# Patient Record
Sex: Male | Born: 1970 | Race: White | Hispanic: No | Marital: Married | Smoking: Never smoker
Health system: Southern US, Community
[De-identification: ages and names within clinical notes are randomized; demographics above are authoritative.]

## PROBLEM LIST (undated history)

## (undated) DIAGNOSIS — E213 Hyperparathyroidism, unspecified: Secondary | ICD-10-CM

## (undated) DIAGNOSIS — I10 Essential (primary) hypertension: Secondary | ICD-10-CM

## (undated) HISTORY — PX: PARATHYROIDECTOMY: SHX19

---

## 2003-06-17 ENCOUNTER — Emergency Department (HOSPITAL_COMMUNITY): Admission: EM | Admit: 2003-06-17 | Discharge: 2003-06-17 | Payer: Self-pay | Admitting: Emergency Medicine

## 2005-06-03 ENCOUNTER — Encounter: Admission: RE | Admit: 2005-06-03 | Discharge: 2005-06-03 | Payer: Self-pay | Admitting: Family Medicine

## 2005-08-10 ENCOUNTER — Encounter: Admission: RE | Admit: 2005-08-10 | Discharge: 2005-08-10 | Payer: Self-pay | Admitting: Surgery

## 2006-05-14 ENCOUNTER — Encounter: Admission: RE | Admit: 2006-05-14 | Discharge: 2006-05-14 | Payer: Self-pay | Admitting: Surgery

## 2008-10-02 ENCOUNTER — Emergency Department (HOSPITAL_COMMUNITY): Admission: EM | Admit: 2008-10-02 | Discharge: 2008-10-02 | Payer: Self-pay | Admitting: Emergency Medicine

## 2009-01-20 ENCOUNTER — Ambulatory Visit (HOSPITAL_COMMUNITY): Admission: RE | Admit: 2009-01-20 | Discharge: 2009-01-21 | Payer: Self-pay | Admitting: Surgery

## 2009-01-20 ENCOUNTER — Encounter (INDEPENDENT_AMBULATORY_CARE_PROVIDER_SITE_OTHER): Payer: Self-pay | Admitting: Surgery

## 2010-12-16 ENCOUNTER — Encounter: Payer: Self-pay | Admitting: Surgery

## 2011-03-12 LAB — CALCIUM: Calcium: 10 mg/dL (ref 8.4–10.5)

## 2011-04-09 NOTE — Op Note (Signed)
Mason Mcgee, Mason Mcgee                 ACCOUNT NO.:  [id number]   MEDICAL RECORD NO.:  [id number]          PATIENT TYPE:  OIB   LOCATION:  1531                         FACILITY:  [facility]   PHYSICIAN:  Velora Heckler, MD      DATE OF BIRTH:  05-09-1971   DATE OF PROCEDURE:  01/20/2009  DATE OF DISCHARGE:                               OPERATIVE REPORT   PREOPERATIVE DIAGNOSIS:  Primary hyperparathyroidism.   POSTOPERATIVE DIAGNOSIS:  Primary hyperparathyroidism.   PROCEDURE:  1. Neck exploration.  2. Right superior parathyroidectomy.  3. Left inferior parathyroidectomy.   SURGEON:  Velora Heckler, MD, FACS   ASSISTANT:  Almond Lint, MD   ANESTHESIA:  General.   ESTIMATED BLOOD LOSS:  Minimal.   PREPARATION:  Betadine.   COMPLICATIONS:  None.   INDICATIONS:  The patient is a 40 year old white male who I have  followed for several years with borderline hyperparathyroidism.  The  patient has continued to have persistent hypercalcemia.  After  consultation with Dr. Talmage Coin, the decision was made to proceed  with neck exploration and parathyroidectomy.   DESCRIPTION OF PROCEDURE:  The procedure was done in OR #6 at the [facility].  The patient was brought to the operating room  and placed in a supine position on the operating room table.  Following  administration of general anesthesia, the patient was positioned and  then prepped and draped in the usual strict aseptic fashion.  After  ascertaining that an adequate level of anesthesia had been achieved, a  right inferior neck incision was made based upon previous ultrasound  examination and suspicious of adenoma in the right inferior position.  Dissection was carried through subcutaneous tissues and platysma and  hemostasis obtained with the electrocautery.  Subplatysmal flaps were  developed circumferentially and a Weitlaner retractor was placed for  exposure.  The strap muscles were incised in the  midline.  The strap  muscles were reflected to the right and the right thyroid lobe was  exposed.  Inferior pole was mobilized by dividing inferior venous  tributaries between medium Ligaclips.  Dissection around the inferior  pole does not reveal any abnormal parathyroid tissue.  Dissection was  carried posteriorly to the precervical fascia, laterally to the carotid  artery, medially to the trachea, and inferiorly to the thyrothymic tract  down to the level of the innominate artery.  The entire area was  explored without any evidence of abnormal parathyroid gland.  Indeed a  normal parathyroid gland was not identified.  There was no corresponding  structure to the 9-mm nodular density seen on ultrasound.  Recurrent  laryngeal nerve was identified and preserved.   At this point, a decision was made to proceed with complete neck  exploration.  Therefore, the incision was extended across the midline.  The platysma was divided.  Skin flaps were elevated cephalad and caudad  from the thyroid notch to the sternal notch.  A Mahorner self-retaining  retractor was placed for exposure.   Continued dissection on the right side allowed for complete mobilization  of  the right thyroid lobe.  Just posterior to the superior pole of the  right lobe was a nodular density just beneath the capsule.  The capsule  was incised and the structure was dissected out.  It had the appearance  of an enlarged parathyroid gland.  Vascular tributaries were divided  between small hemoclips.  The nodule was completely excised.  On  sectioning, there appeared to be two components.  They were marked with  sutures and submitted to pathology.  Dr. Jimmy Picket analyzed the  specimen and notes that there is a thyroid nodule present, as well as  parathyroid tissue which appears normal on frozen section.  A dry pack  was placed in the right neck.   Next, we turned our attention to the left thyroid lobe.  The strap  muscles  are reflected laterally.  The left lobe was mobilized.  Inferior  venous tributaries were divided between medium hemoclips, allowing for  anterior rotation of the left lobe.  Posterior to the left lobe,  exploration was continued.  The left superior parathyroid gland was  identified and was normal.  It was left in situ.  A left inferior  parathyroid gland was identified just below the level of the inferior  thyroid artery.  It appeared grossly enlarged.  It was slightly firm.  It had a slight darker color change.  Inferiorly, the thyrothymic tract  was opened and its contents were excised.  There was no evidence of  ectopic parathyroid adenoma.  Contents were submitted to pathology for  review.  There was also a nodular density on the inferior pole of the  left thyroid lobe which was excised.  This had the appearance of thyroid  tissue, but was also submitted to pathology for review.  Finally, the  left inferior parathyroid gland at the level of the inferior thyroid  artery was excised by mobilizing it completely and dividing its vascular  pedicle between medium hemoclips.  It was submitted in its entirety to  pathology for frozen section.  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   The neck was inspected for good hemostasis.  Surgicel was placed in the  operative field bilaterally.  The strap muscles were reapproximated in  the midline with interrupted 3-0 Vicryl sutures.  The platysma was  closed with interrupted 3-0 Vicryl sutures.  The skin was closed with a  running 4-0 Monocryl subcuticular suture.  The wound was washed and  dried and Benzoin Steri-Strips were applied.  Sterile dressings were  applied.  The patient was awakened from anesthesia and brought to the  recovery room in stable condition.  The patient tolerated the procedure  well.      Velora Heckler, MD  Electronically Signed     TMG/MEDQ  D:  01/20/2009  T:  01/20/2009   Job:  [id number]   cc:   Talmage Coin, M.D.   Bryan Lemma. Manus Gunning, M.D.  Fax: [phone]

## 2014-09-01 ENCOUNTER — Other Ambulatory Visit: Payer: Self-pay | Admitting: Family Medicine

## 2014-09-01 ENCOUNTER — Ambulatory Visit
Admission: RE | Admit: 2014-09-01 | Discharge: 2014-09-01 | Disposition: A | Discharge status: Home / Self Care | Payer: 59 | Source: Ambulatory Visit | Attending: Family Medicine | Admitting: Family Medicine

## 2014-09-01 DIAGNOSIS — M25462 Effusion, left knee: Secondary | ICD-10-CM

## 2014-09-13 ENCOUNTER — Other Ambulatory Visit: Payer: Self-pay | Admitting: Family Medicine

## 2015-03-15 ENCOUNTER — Other Ambulatory Visit: Payer: Self-pay | Admitting: Family Medicine

## 2015-05-10 ENCOUNTER — Ambulatory Visit
Admission: RE | Admit: 2015-05-10 | Discharge: 2015-05-10 | Disposition: A | Discharge status: Home / Self Care | Payer: 59 | Source: Ambulatory Visit | Attending: Family Medicine | Admitting: Family Medicine

## 2015-05-10 ENCOUNTER — Other Ambulatory Visit: Payer: Self-pay | Admitting: Family Medicine

## 2015-05-10 DIAGNOSIS — R0781 Pleurodynia: Secondary | ICD-10-CM

## 2016-02-05 ENCOUNTER — Emergency Department (INDEPENDENT_AMBULATORY_CARE_PROVIDER_SITE_OTHER)
Admission: EM | Admit: 2016-02-05 | Discharge: 2016-02-05 | Disposition: A | Discharge status: Home / Self Care | Payer: BLUE CROSS/BLUE SHIELD | Source: Home / Self Care | Attending: Emergency Medicine | Admitting: Emergency Medicine

## 2016-02-05 ENCOUNTER — Encounter (HOSPITAL_COMMUNITY): Payer: Self-pay | Admitting: Emergency Medicine

## 2016-02-05 DIAGNOSIS — L03115 Cellulitis of right lower limb: Secondary | ICD-10-CM | POA: Diagnosis not present

## 2016-02-05 DIAGNOSIS — M7041 Prepatellar bursitis, right knee: Secondary | ICD-10-CM | POA: Diagnosis not present

## 2016-02-05 HISTORY — DX: Essential (primary) hypertension: I10

## 2016-02-05 HISTORY — DX: Hyperparathyroidism, unspecified: E21.3

## 2016-02-05 MED ORDER — CLINDAMYCIN HCL 300 MG PO CAPS: 300.0000 mg | ORAL_CAPSULE | Freq: Three times a day (TID) | ORAL | Status: DC

## 2016-02-05 MED ORDER — CEFTRIAXONE SODIUM 1 G IJ SOLR
1.0000 g | Freq: Once | INTRAMUSCULAR | Status: AC
  Administered 2016-02-05: 1 g via INTRAMUSCULAR

## 2016-02-05 MED ORDER — LIDOCAINE HCL (PF) 1 % IJ SOLN
INTRAMUSCULAR | Status: AC
  Filled 2016-02-05: qty 5

## 2016-02-05 MED ORDER — CEFTRIAXONE SODIUM 1 G IJ SOLR
INTRAMUSCULAR | Status: AC
  Filled 2016-02-05: qty 10

## 2016-02-05 MED ORDER — LIDOCAINE-EPINEPHRINE (PF) 2 %-1:200000 IJ SOLN
INTRAMUSCULAR | Status: AC
  Filled 2016-02-05: qty 20

## 2016-02-05 NOTE — ED Notes (Signed)
Patient not ready for discharge-patient must wait-post injection delay

## 2016-02-05 NOTE — Discharge Instructions (Signed)
Cellulitis Cellulitis is an infection of the skin and the tissue beneath it. The infected area is usually red and tender. Cellulitis occurs most often in the arms and lower legs.  CAUSES  Cellulitis is caused by bacteria that enter the skin through cracks or cuts in the skin. The most common types of bacteria that cause cellulitis are staphylococci and streptococci. SIGNS AND SYMPTOMS   Redness and warmth.  Swelling.  Tenderness or pain.  Fever. DIAGNOSIS  Your health care provider can usually determine what is wrong based on a physical exam. Blood tests may also be done. TREATMENT  Treatment usually involves taking an antibiotic medicine. HOME CARE INSTRUCTIONS   Take your antibiotic medicine as directed by your health care provider. Finish the antibiotic even if you start to feel better.  Keep the infected arm or leg elevated to reduce swelling.  Apply a warm cloth to the affected area up to 4 times per day to relieve pain.  Take medicines only as directed by your health care provider.  Keep all follow-up visits as directed by your health care provider. SEEK MEDICAL CARE IF:   You notice red streaks coming from the infected area.  Your red area gets larger or turns dark in color.  Your bone or joint underneath the infected area becomes painful after the skin has healed.  Your infection returns in the same area or another area.  You notice a swollen bump in the infected area.  You develop new symptoms.  You have a fever. SEEK IMMEDIATE MEDICAL CARE IF:   You feel very sleepy.  You develop vomiting or diarrhea.  You have a general ill feeling (malaise) with muscle aches and pains.   This information is not intended to replace advice given to you by your health care provider. Make sure you discuss any questions you have with your health care provider.   Document Released: 08/21/2005 Document Revised: 08/02/2015 Document Reviewed: 01/27/2012 Elsevier Interactive  Patient Education 2016 [employer]  Prepatellar Bursitis With Rehab  Bursitis is a condition that is characterized by inflammation of a bursa. Kateri McBursa exists in many areas of the body. They are fluid-filled sacs that lie between a soft tissue (skin, tendon, or ligament) and a bone, and they reduce friction between the structures as well as the stress placed on the soft tissue. Prepatellar bursitis is inflammation of the bursa that lies between the skin and the kneecap (patella). This condition often causes pain over the patella. SYMPTOMS   Pain, tenderness, and/or inflammation over the patella.  Pain that worsens with movement of the knee joint.  Decreased range of motion for the knee joint.  A crackling sound (crepitation) when the bursa is moved or touched.  Occasionally, painless swelling of the bursa.  Fever (when infected). CAUSES  Bursitis is caused by damage to the bursa, which results in an inflammatory response. Common mechanisms of injury include:  Direct trauma to the front of the knee.  Repetitive and/or stressful use of the knee. RISK INCREASES WITH:  Activities in which kneeling and/or falling on one's knees is likely (volleyball or football).  Repetitive and stressful training, especially if it involves running on hills.  Improper training techniques, such as a sudden increase in the intensity, frequency, or duration of training.  Failure to warm up properly before activity.  Poor technique.  Artificial turf. PREVENTION   Avoid kneeling or falling on your knees.  Warm up and stretch properly before activity.  Allow for adequate recovery  between workouts.  Maintain physical fitness:  Strength, flexibility, and endurance.  Cardiovascular fitness.  Learn and use proper technique. When possible, have a coach correct improper technique.  Wear properly fitted and padded protective equipment (knee pads). PROGNOSIS  If treated properly, then the symptoms  of prepatellar bursitis usually resolve within 2 weeks. RELATED COMPLICATIONS   Recurrent symptoms that result in a chronic problem.  Prolonged healing time, if improperly treated or reinjured.  Limited range of motion.  Infection of bursa.  Chronic inflammation or scarring of bursa. TREATMENT  Treatment initially involves the use of ice and medication to help reduce pain and inflammation. The use of strengthening and stretching exercises may help reduce pain with activity, especially those of the quadriceps and hamstring muscles. These exercises may be performed at home or with referral to a therapist. Your caregiver may recommend knee pads when you return to playing sports, in order to reduce the stress on the prepatellar bursa. If symptoms persist despite treatment, then your caregiver may drain fluid out with a needle (aspirate) the bursa. If symptoms persist for greater than 6 months despite nonsurgical (conservative) treatment, then surgery may be recommended to remove the bursa.  MEDICATION  If pain medication is necessary, then nonsteroidal anti-inflammatory medications, such as aspirin and ibuprofen, or other minor pain relievers, such as acetaminophen, are often recommended.  Do not take pain medication for 7 days before surgery.  Prescription pain relievers may be given if deemed necessary by your caregiver. Use only as directed and only as much as you need.  Corticosteroid injections may be given by your caregiver. These injections should be reserved for the most serious cases, because they may only be given a certain number of times. HEAT AND COLD  Cold treatment (icing) relieves pain and reduces inflammation. Cold treatment should be applied for 10 to 15 minutes every 2 to 3 hours for inflammation and pain and immediately after any activity that aggravates your symptoms. Use ice packs or massage the area with a piece of ice (ice massage).  Heat treatment may be used prior to  performing the stretching and strengthening activities prescribed by your caregiver, physical therapist, or athletic trainer. Use a heat pack or soak the injury in warm water. SEEK MEDICAL CARE IF:  Treatment seems to offer no benefit, or the condition worsens.  Any medications produce adverse side effects. EXERCISES RANGE OF MOTION (ROM) AND STRETCHING EXERCISES - Prepatellar Bursitis These exercises may help you when beginning to rehabilitate your injury. Your symptoms may resolve with or without further involvement from your physician, physical therapist or athletic trainer. While completing these exercises, remember:   Restoring tissue flexibility helps normal motion to return to the joints. This allows healthier, less painful movement and activity.  An effective stretch should be held for at least 30 seconds.  A stretch should never be painful. You should only feel a gentle lengthening or release in the stretched tissue. STRETCH - Hamstrings, Standing  Stand or sit and extend your right / left leg, placing your foot on a chair or foot stool  Keeping a slight arch in your low back and your hips straight forward.  Lead with your chest and lean forward at the waist until you feel a gentle stretch in the back of your right / left knee or thigh. (When done correctly, this exercise requires leaning only a small distance.)  Hold this position for __________ seconds. Repeat __________ times. Complete this stretch __________ times per day. STRETCH -  Quadriceps, Prone   Lie on your stomach on a firm surface, such as a bed or padded floor.  Bend your right / left knee and grasp your ankle. If you are unable to reach, your ankle or pant leg, use a belt around your foot to lengthen your reach.  Gently pull your heel toward your buttocks. Your knee should not slide out to the side. You should feel a stretch in the front of your thigh and/or knee.  Hold this position for __________  seconds. Repeat __________ times. Complete this stretch __________ times per day.  STRETCH - Hamstrings/Adductors, V-Sit   Sit on the floor with your legs extended in a large "V," keeping your knees straight.  With your head and chest upright, bend at your waist reaching for your right foot to stretch your left adductors.  You should feel a stretch in your left inner thigh. Hold for __________ seconds.  Return to the upright position to relax your leg muscles.  Continuing to keep your chest upright, bend straight forward at your waist to stretch your hamstrings.  You should feel a stretch behind both of your thighs and/or knees. Hold for __________ seconds.  Return to the upright position to relax your leg muscles.  Repeat steps 2 through 4. Repeat __________ times. Complete this exercise __________ times per day.  STRENGTHENING EXERCISES - Prepatellar Bursitis  These exercises may help you when beginning to rehabilitate your injury. They may resolve your symptoms with or without further involvement from your physician, physical therapist or athletic trainer. While completing these exercises, remember:  Muscles can gain both the endurance and the strength needed for everyday activities through controlled exercises.  Complete these exercises as instructed by your physician, physical therapist or athletic trainer. Progress the resistance and repetitions only as guided. STRENGTH - Quadriceps, Isometrics  Lie on your back with your right / left leg extended and your opposite knee bent.  Gradually tense the muscles in the front of your right / left thigh. You should see either your kneecap slide up toward your hip or increased dimpling just above the knee. This motion will push the back of the knee down toward the floor/mat/bed on which you are lying.  Hold the muscle as tight as you can without increasing your pain for __________ seconds.  Relax the muscles slowly and completely in  between each repetition. Repeat __________ times. Complete this exercise __________ times per day.  STRENGTH - Quadriceps, Short Arcs   Lie on your back. Place a __________ inch towel roll under your knee so that the knee slightly bends.  Raise only your lower leg by tightening the muscles in the front of your thigh. Do not allow your thigh to rise.  Hold this position for __________ seconds. Repeat __________ times. Complete this exercise __________ times per day.  OPTIONAL ANKLE WEIGHTS: Begin with ____________________, but DO NOT exceed ____________________. Increase in1 lb/0.5 kg increments.  STRENGTH - Quadriceps, Straight Leg Raises  Quality counts! Watch for signs that the quadriceps muscle is working to insure you are strengthening the correct muscles and not "cheating" by substituting with healthier muscles.  Lay on your back with your right / left leg extended and your opposite knee bent.  Tense the muscles in the front of your right / left thigh. You should see either your kneecap slide up or increased dimpling just above the knee. Your thigh may even quiver.  Tighten these muscles even more and raise your leg 4 to  6 inches off the floor. Hold for __________ seconds.  Keeping these muscles tense, lower your leg.  Relax the muscles slowly and completely in between each repetition. Repeat __________ times. Complete this exercise __________ times per day.  STRENGTH - Quadriceps, Step-Ups   Use a thick book, step or step stool that is __________ inches tall.  Holding a wall or counter for balance only, not support.  Slowly step-up with your right / left foot, keeping your knee in line with your hip and foot. Do not allow your knee to bend so far that you cannot see your toes.  Slowly unlock your knee and lower yourself to the starting position. Your muscles, not gravity, should lower you. Repeat __________ times. Complete this exercise __________ times per day.   This  information is not intended to replace advice given to you by your health care provider. Make sure you discuss any questions you have with your health care provider.   Document Released: 11/11/2005 Document Revised: 08/02/2015 Document Reviewed: 02/23/2009 Elsevier Interactive Patient Education 2016 Elsevier Inc.  [employer] Therapy Heat therapy can help ease sore, stiff, injured, and tight muscles and joints. Heat relaxes your muscles, which may help ease your pain. Heat therapy should only be used on old, pre-existing, or long-lasting (chronic) injuries. Do not use heat therapy unless told by your doctor. HOW TO USE HEAT THERAPY There are several different kinds of heat therapy, including:  Moist heat pack.  Warm water bath.  Hot water bottle.  Electric heating pad.  Heated gel pack.  Heated wrap.  Electric heating pad. GENERAL HEAT THERAPY RECOMMENDATIONS   Do not sleep while using heat therapy. Only use heat therapy while you are awake.  Your skin may turn pink while using heat therapy. Do not use heat therapy if your skin turns red.  Do not use heat therapy if you have new pain.  High heat or long exposure to heat can cause burns. Be careful when using heat therapy to avoid burning your skin.  Do not use heat therapy on areas of your skin that are already irritated, such as with a rash or sunburn. GET HELP IF:   You have blisters, redness, swelling (puffiness), or numbness.  You have new pain.  Your pain is worse. MAKE SURE YOU:  Understand these instructions.  Will watch your condition.  Will get help right away if you are not doing well or get worse.   This information is not intended to replace advice given to you by your health care provider. Make sure you discuss any questions you have with your health care provider.   Document Released: 02/03/2012 Document Revised: 12/02/2014 Document Reviewed: 01/04/2014 Elsevier Interactive Patient Education [employer].

## 2016-02-05 NOTE — ED Provider Notes (Signed)
CSN: [id number]     Arrival date & time 02/05/16  1306 History   First MD Initiated Contact with Patient 02/05/16 1508     Chief Complaint  Patient presents with  . Knee Pain   (Consider location/radiation/quality/duration/timing/severity/associated sxs/prior Treatment) HPI Comments: 45 year old male is accompanied by his wife with a complaint of redness, pain and swelling over the knee and below the knee. 3 days ago there was a "infected hair" just below the patella. He removed the infected hair and was able to express some pus. Within the ensuing couple of days there has developed a localized swelling just below the patella. The appearance is similar to swelling of a prepatellar bursa. There is also erythema surrounding this area of swelling that extends to the patella as well has distally along the anterior leg. There is no calf pain or tenderness. No erythema or swelling to the calf. Denies systemic symptoms.   Past Medical History  Diagnosis Date  . Hyperparathyroidism (HCC)   . Hypertension    Past Surgical History  Procedure Laterality Date  . Parathyroidectomy     History reviewed. No pertinent family history. Social History  Substance Use Topics  . Smoking status: Never Smoker   . Smokeless tobacco: None  . Alcohol Use: Yes    Review of Systems  Constitutional: Negative.  Negative for fever and fatigue.  Respiratory: Negative.   Genitourinary: Negative.   Musculoskeletal: Negative.   Skin: Positive for color change. Negative for rash.       As per history of present illness.  Neurological: Negative.   All other systems reviewed and are negative.   Allergies  Review of patient's allergies indicates no known allergies.  Home Medications   Prior to Admission medications   Medication Sig Start Date End Date Taking? Authorizing Provider  Cholecalciferol (VITAMIN D PO) Take by mouth.   Yes Historical Provider, MD  FIBER PO Take by mouth.   Yes Historical Provider, MD   Glucosamine HCl (GLUCOSAMINE PO) Take by mouth.   Yes Historical Provider, MD  lisinopril (PRINIVIL,ZESTRIL) 10 MG tablet Take 10 mg by mouth daily.   Yes Historical Provider, MD  Multiple Vitamin (MULTIVITAMIN) capsule Take 1 capsule by mouth daily.   Yes Historical Provider, MD  clindamycin (CLEOCIN) 300 MG capsule Take 1 capsule (300 mg total) by mouth 3 (three) times daily. 02/05/16   Hayden Rasmussen, NP   Meds Ordered and Administered this Visit   Medications  cefTRIAXone (ROCEPHIN) injection 1 g (not administered)    There were no vitals taken for this visit. No data found.   Physical Exam  Constitutional: He is oriented to person, place, and time. He appears well-developed and well-nourished. No distress.  Eyes: EOM are normal.  Neck: Normal range of motion. Neck supple.  Cardiovascular: Normal rate.   Pulmonary/Chest: Effort normal. No respiratory distress.  Musculoskeletal: He exhibits no edema.  Knee with full range of motion, extension and flexion. No tenderness over the knee joint. As per history of present illness there is a mounded area of swelling that is well localized and annular. It is primarily flesh-colored. Measures approximately 4 cm across. It is tender. It is firm but palpates as fluctuant.  Neurological: He is alert and oriented to person, place, and time. No cranial nerve deficit. He exhibits normal muscle tone.  Skin: Skin is warm and dry.  Psychiatric: He has a normal mood and affect.  Nursing note and vitals reviewed.   ED Course  Aspiration of  blood/fluid Date/Time: 02/05/2016 3:56 PM Performed by: Phineas RealMABE, Elisavet Buehrer Authorized by: Charm RingsHONIG, ERIN J Consent: Verbal consent obtained. Risks and benefits: risks, benefits and alternatives were discussed Consent given by: patient Patient understanding: patient states understanding of the procedure being performed Patient identity confirmed: verbally with patient Preparation: Patient was prepped and draped in the usual  sterile fashion. Local anesthesia used: yes Anesthesia: local infiltration Local anesthetic: lidocaine 2% with epinephrine Anesthetic total: 2 ml Patient sedated: no Patient tolerance: Patient tolerated the procedure well with no immediate complications Comments: Using an 18-gauge needle attached to a syringe the needle was [facility] area of swelling just below the patella. Approximately 3 mm of clear liquid was removed. There was no purulence or bleeding.   (including critical care time)  Labs Review Labs Reviewed - No data to display  Imaging Review No results found.   Visual Acuity Review  Right Eye Distance:   Left Eye Distance:   Bilateral Distance:    Right Eye Near:   Left Eye Near:    Bilateral Near:         MDM   1. Cellulitis of right lower extremity   2. Prepatellar bursitis of right knee    Meds ordered this encounter  Medications  . lisinopril (PRINIVIL,ZESTRIL) 10 MG tablet    Sig: Take 10 mg by mouth daily.  Marland Kitchen. FIBER PO    Sig: Take by mouth.  . Multiple Vitamin (MULTIVITAMIN) capsule    Sig: Take 1 capsule by mouth daily.  . Glucosamine HCl (GLUCOSAMINE PO)    Sig: Take by mouth.  . Cholecalciferol (VITAMIN D PO)    Sig: Take by mouth.  . cefTRIAXone (ROCEPHIN) injection 1 g    Sig:   . clindamycin (CLEOCIN) 300 MG capsule    Sig: Take 1 capsule (300 mg total) by mouth 3 (three) times daily.    Dispense:  30 capsule    Refill:  0    Order Specific Question:  Supervising Provider    Answer:  Charm RingsHONIG, ERIN J [[id number]]   Heat, meds as directed. NSAIDS for pain     Hayden Rasmussenavid Daviana Haymaker, NP 02/05/16 [id number]

## 2016-02-05 NOTE — ED Notes (Addendum)
Right knee pain, swelling.  Patient and spouse reports plucking a "infected hair" from knee and expelled pus 2 days ago.  Now reports pain significantly worse.  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

## 2016-02-28 DIAGNOSIS — I10 Essential (primary) hypertension: Secondary | ICD-10-CM | POA: Diagnosis not present

## 2016-02-28 DIAGNOSIS — E7800 Pure hypercholesterolemia, unspecified: Secondary | ICD-10-CM | POA: Diagnosis not present

## 2016-02-28 DIAGNOSIS — E213 Hyperparathyroidism, unspecified: Secondary | ICD-10-CM | POA: Diagnosis not present

## 2016-02-28 DIAGNOSIS — R7301 Impaired fasting glucose: Secondary | ICD-10-CM | POA: Diagnosis not present

## 2016-03-12 DIAGNOSIS — M25861 Other specified joint disorders, right knee: Secondary | ICD-10-CM | POA: Diagnosis not present

## 2016-09-06 DIAGNOSIS — J029 Acute pharyngitis, unspecified: Secondary | ICD-10-CM | POA: Diagnosis not present

## 2016-09-06 DIAGNOSIS — R05 Cough: Secondary | ICD-10-CM | POA: Diagnosis not present

## 2016-09-18 DIAGNOSIS — E213 Hyperparathyroidism, unspecified: Secondary | ICD-10-CM | POA: Diagnosis not present

## 2016-09-18 DIAGNOSIS — Z0000 Encounter for general adult medical examination without abnormal findings: Secondary | ICD-10-CM | POA: Diagnosis not present

## 2016-09-18 DIAGNOSIS — R7301 Impaired fasting glucose: Secondary | ICD-10-CM | POA: Diagnosis not present

## 2016-09-18 DIAGNOSIS — I10 Essential (primary) hypertension: Secondary | ICD-10-CM | POA: Diagnosis not present

## 2016-09-18 DIAGNOSIS — E7800 Pure hypercholesterolemia, unspecified: Secondary | ICD-10-CM | POA: Diagnosis not present

## 2017-04-09 DIAGNOSIS — E213 Hyperparathyroidism, unspecified: Secondary | ICD-10-CM | POA: Diagnosis not present

## 2017-04-09 DIAGNOSIS — I10 Essential (primary) hypertension: Secondary | ICD-10-CM | POA: Diagnosis not present

## 2017-04-09 DIAGNOSIS — E7800 Pure hypercholesterolemia, unspecified: Secondary | ICD-10-CM | POA: Diagnosis not present

## 2017-04-09 DIAGNOSIS — R7301 Impaired fasting glucose: Secondary | ICD-10-CM | POA: Diagnosis not present

## 2017-10-22 DIAGNOSIS — E213 Hyperparathyroidism, unspecified: Secondary | ICD-10-CM | POA: Diagnosis not present

## 2017-10-22 DIAGNOSIS — R7301 Impaired fasting glucose: Secondary | ICD-10-CM | POA: Diagnosis not present

## 2017-10-22 DIAGNOSIS — Z0001 Encounter for general adult medical examination with abnormal findings: Secondary | ICD-10-CM | POA: Diagnosis not present

## 2017-10-22 DIAGNOSIS — I10 Essential (primary) hypertension: Secondary | ICD-10-CM | POA: Diagnosis not present

## 2017-10-22 DIAGNOSIS — E7800 Pure hypercholesterolemia, unspecified: Secondary | ICD-10-CM | POA: Diagnosis not present

## 2017-10-29 DIAGNOSIS — M25562 Pain in left knee: Secondary | ICD-10-CM | POA: Diagnosis not present

## 2018-04-22 DIAGNOSIS — I10 Essential (primary) hypertension: Secondary | ICD-10-CM | POA: Diagnosis not present

## 2018-04-22 DIAGNOSIS — E213 Hyperparathyroidism, unspecified: Secondary | ICD-10-CM | POA: Diagnosis not present

## 2018-04-22 DIAGNOSIS — R531 Weakness: Secondary | ICD-10-CM | POA: Diagnosis not present

## 2018-04-22 DIAGNOSIS — E7800 Pure hypercholesterolemia, unspecified: Secondary | ICD-10-CM | POA: Diagnosis not present

## 2018-04-22 DIAGNOSIS — R7301 Impaired fasting glucose: Secondary | ICD-10-CM | POA: Diagnosis not present

## 2018-05-03 ENCOUNTER — Emergency Department (HOSPITAL_COMMUNITY)
Admission: EM | Admit: 2018-05-03 | Discharge: 2018-05-03 | Disposition: A | Discharge status: Home / Self Care | Payer: BLUE CROSS/BLUE SHIELD | Attending: Emergency Medicine | Admitting: Emergency Medicine

## 2018-05-03 ENCOUNTER — Encounter (HOSPITAL_COMMUNITY): Payer: Self-pay | Admitting: Emergency Medicine

## 2018-05-03 ENCOUNTER — Emergency Department (HOSPITAL_COMMUNITY): Payer: BLUE CROSS/BLUE SHIELD

## 2018-05-03 DIAGNOSIS — R51 Headache: Secondary | ICD-10-CM | POA: Insufficient documentation

## 2018-05-03 DIAGNOSIS — I10 Essential (primary) hypertension: Secondary | ICD-10-CM | POA: Diagnosis not present

## 2018-05-03 DIAGNOSIS — S199XXA Unspecified injury of neck, initial encounter: Secondary | ICD-10-CM | POA: Diagnosis not present

## 2018-05-03 DIAGNOSIS — M25512 Pain in left shoulder: Secondary | ICD-10-CM | POA: Insufficient documentation

## 2018-05-03 DIAGNOSIS — R0789 Other chest pain: Secondary | ICD-10-CM | POA: Diagnosis not present

## 2018-05-03 NOTE — Discharge Instructions (Addendum)
Take ibuprofen 3 times a day with meals.  Do not take other anti-inflammatories at the same time open (Advil, Motrin, naproxen, Aleve). You may supplement with Tylenol if you need further pain control. You may try muscle creams to help with pain (bengay, salonpas, icy hot). Use ice packs or heating pads if this helps control your pain. You may do stretches and a massage for pain control.  You will likely have continued muscle stiffness and soreness over the next couple days.  Follow-up with primary care in 1 week if your symptoms are not improving. Return to the emergency room if you develop vision changes, vomiting, slurred speech, numbness, loss of bowel or bladder control, or any new or worsening symptoms.
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
## 2018-05-13 DIAGNOSIS — M25512 Pain in left shoulder: Secondary | ICD-10-CM | POA: Diagnosis not present

## 2018-05-13 DIAGNOSIS — M79671 Pain in right foot: Secondary | ICD-10-CM | POA: Diagnosis not present

## 2018-05-13 DIAGNOSIS — S4992XA Unspecified injury of left shoulder and upper arm, initial encounter: Secondary | ICD-10-CM | POA: Diagnosis not present

## 2018-05-25 DIAGNOSIS — M25512 Pain in left shoulder: Secondary | ICD-10-CM | POA: Diagnosis not present

## 2018-05-25 DIAGNOSIS — M542 Cervicalgia: Secondary | ICD-10-CM | POA: Diagnosis not present

## 2018-06-04 DIAGNOSIS — M25512 Pain in left shoulder: Secondary | ICD-10-CM | POA: Diagnosis not present

## 2018-06-04 DIAGNOSIS — M542 Cervicalgia: Secondary | ICD-10-CM | POA: Diagnosis not present

## 2018-06-12 DIAGNOSIS — M25512 Pain in left shoulder: Secondary | ICD-10-CM | POA: Diagnosis not present

## 2018-06-12 DIAGNOSIS — M542 Cervicalgia: Secondary | ICD-10-CM | POA: Diagnosis not present

## 2018-06-16 DIAGNOSIS — M542 Cervicalgia: Secondary | ICD-10-CM | POA: Diagnosis not present

## 2018-06-16 DIAGNOSIS — M25512 Pain in left shoulder: Secondary | ICD-10-CM | POA: Diagnosis not present

## 2018-06-23 DIAGNOSIS — M542 Cervicalgia: Secondary | ICD-10-CM | POA: Diagnosis not present

## 2018-06-23 DIAGNOSIS — M25512 Pain in left shoulder: Secondary | ICD-10-CM | POA: Diagnosis not present

## 2018-06-26 ENCOUNTER — Encounter: Payer: Self-pay | Admitting: Endocrinology

## 2018-06-26 ENCOUNTER — Ambulatory Visit (INDEPENDENT_AMBULATORY_CARE_PROVIDER_SITE_OTHER): Payer: BLUE CROSS/BLUE SHIELD | Admitting: Endocrinology

## 2018-06-26 DIAGNOSIS — E162 Hypoglycemia, unspecified: Secondary | ICD-10-CM | POA: Diagnosis not present

## 2018-06-26 MED ORDER — METFORMIN HCL ER 500 MG PO TB24: 500.0000 mg | ORAL_TABLET | Freq: Every day | ORAL | 3 refills | Status: DC

## 2018-06-26 NOTE — Patient Instructions (Addendum)
I have sent a prescription to your pharmacy, for metformin-XR. Please continue to check if you have symptoms Please come back for a follow-up appointment in 2 months.    Hypoglycemia Hypoglycemia occurs when the level of sugar (glucose) in the blood is too low. Glucose is a type of sugar that provides the body's main source of energy. Certain hormones (insulin and glucagon) control the level of glucose in the blood. Insulin lowers blood glucose, and glucagon increases blood glucose. Hypoglycemia can result from having too much insulin in the bloodstream, or from not eating enough food that contains glucose. Hypoglycemia can happen in people who do or do not have diabetes. It can develop quickly, and it can be a medical emergency. What are the causes? Hypoglycemia occurs most often in people who have diabetes. If you have diabetes, hypoglycemia may be caused by:  Diabetes medicine.  Not eating enough, or not eating often enough.  Increased physical activity.  Drinking alcohol, especially when you have not eaten recently.  If you do not have diabetes, hypoglycemia may be caused by:  A tumor in the pancreas. The pancreas is the organ that makes insulin.  Not eating enough, or not eating for long periods at a time (fasting).  Severe infection or illness that affects the liver, heart, or kidneys.  Certain medicines.  You may also have reactive hypoglycemia. This condition causes hypoglycemia within 4 hours of eating a meal. This may occur after having stomach surgery. Sometimes, the cause of reactive hypoglycemia is not known. What increases the risk? Hypoglycemia is more likely to develop in:  People who have diabetes and take medicines to lower blood glucose.  People who abuse alcohol.  People who have a severe illness.  What are the signs or symptoms? Hypoglycemia may not cause any symptoms. If you have symptoms, they may include:  Hunger.  Anxiety.  Sweating and feeling  clammy.  Confusion.  Dizziness or feeling light-headed.  Sleepiness.  Nausea.  Increased heart rate.  Headache.  Blurry vision.  Seizure.  Nightmares.  Tingling or numbness around the mouth, lips, or tongue.  A change in speech.  Decreased ability to concentrate.  A change in coordination.  Restless sleep.  Tremors or shakes.  Fainting.  Irritability.  How is this diagnosed? Hypoglycemia is diagnosed with a blood test to measure your blood glucose level. This blood test is done while you are having symptoms. Your health care provider may also do a physical exam and review your medical history. If you do not have diabetes, other tests may be done to find the cause of your hypoglycemia. How is this treated? This condition can often be treated by immediately eating or drinking something that contains glucose, such as:  3-4 sugar tablets (glucose pills).  Glucose gel, 15-gram tube.  Fruit juice, 4 oz (120 mL).  Regular soda (not diet soda), 4 oz (120 mL).  Low-fat milk, 4 oz (120 mL).  Several pieces of hard candy.  Sugar or honey, 1 Tbsp.  Treating Severe Hypoglycemia Severe hypoglycemia is when your blood glucose level is at or below 54 mg/dL (3 mmol/L). Severe hypoglycemia is an emergency. Do not wait to see if the symptoms will go away. Get medical help right away. Call your local emergency services (911 in the [place] Do not drive yourself to the hospital. If you have severe hypoglycemia and you cannot eat or drink, you may need an injection of glucagon. A family member or close friend should learn how  to check your blood glucose and how to give you a glucagon injection. Ask your health care provider if you need to have an emergency glucagon injection kit available. Severe hypoglycemia may need to be treated in a hospital. The treatment may include getting glucose through an IV tube. You may also need treatment for the cause of your hypoglycemia. Follow  these instructions at home: General instructions  Avoid any diets that cause you to not eat enough food. Talk with your health care provider before you start any new diet.  Take over-the-counter and prescription medicines only as told by your health care provider.  Limit alcohol intake to no more than 1 drink per day for nonpregnant women and 2 drinks per day for men. One drink equals 12 oz of beer, 5 oz of wine, or 1 oz of hard liquor.  Keep all follow-up visits as told by your health care provider. This is important.  If You Have Reactive Hypoglycemia or Low Blood Sugar From Other Causes:  Monitor your blood glucose as told by your health care provider.  Follow instructions from your health care provider about eating or drinking restrictions. Contact a health care provider if:  You have problems keeping your blood glucose in your target range.  You have frequent episodes of hypoglycemia. Get help right away if:  You continue to have hypoglycemia symptoms after eating or drinking something containing glucose.  Your blood glucose is at or below 54 mg/dL (3 mmol/L).  You have a seizure.  You faint. These symptoms may represent a serious problem that is an emergency. Do not wait to see if the symptoms will go away. Get medical help right away. Call your local emergency services (911 in the [place] Do not drive yourself to the hospital. This information is not intended to replace advice given to you by your health care provider. Make sure you discuss any questions you have with your health care provider. Document Released: 11/11/2005 Document Revised: 04/24/2016 Document Reviewed: 12/15/2015 Elsevier Interactive Patient Education  [employer].

## 2018-06-26 NOTE — Progress Notes (Signed)
Subjective:    Patient ID: Mason Mcgee, male    DOB: 1971-07-27, 47 y.o.   MRN: [id number]  HPI Pt is referred by Dr Marisue Humble, for hypoglycemia.  Pt reports many years of intermittent severe tremor of the hands, and assoc diaphoresis.  This happens approx 20 after eating, especially with exercise.   He has checked cbg during the episode, and got 64-80.  Symptoms are relieved by eating or drinking.  he takes no medication for the blood sugar.  he has never had weight loss surgery, alcohol intake, adrenal problems, pituitary problems, liver problems, or kidney problems.   Past Medical History:  Diagnosis Date  . Hyperparathyroidism ([place])   . Hypertension     Past Surgical History:  Procedure Laterality Date  . PARATHYROIDECTOMY      Social History   Socioeconomic History  . Marital status: Married    Spouse name: Not on file  . Number of children: Not on file  . Years of education: Not on file  . Highest education level: Not on file  Occupational History  . Not on file  Social Needs  . Financial resource strain: Not on file  . Food insecurity:    Worry: Not on file    Inability: Not on file  . Transportation needs:    Medical: Not on file    Non-medical: Not on file  Tobacco Use  . Smoking status: Never Smoker  . Smokeless tobacco: Never Used  Substance and Sexual Activity  . Alcohol use: Yes  . Drug use: No  . Sexual activity: Not on file  Lifestyle  . Physical activity:    Days per week: Not on file    Minutes per session: Not on file  . Stress: Not on file  Relationships  . Social connections:    Talks on phone: Not on file    Gets together: Not on file    Attends religious service: Not on file    Active member of club or organization: Not on file    Attends meetings of clubs or organizations: Not on file    Relationship status: Not on file  . Intimate partner violence:    Fear of current or ex partner: Not on file    Emotionally abused: Not on file   Physically abused: Not on file    Forced sexual activity: Not on file  Other Topics Concern  . Not on file  Social History Narrative  . Not on file    Current Outpatient Medications on File Prior to Visit  Medication Sig Dispense Refill  . Cholecalciferol (VITAMIN D PO) Take by mouth.    . FIBER PO Take by mouth.    . Glucosamine HCl (GLUCOSAMINE PO) Take by mouth.    Marland Kitchen lisinopril (PRINIVIL,ZESTRIL) 10 MG tablet Take 10 mg by mouth daily.    . Loratadine 10 MG CAPS     . Multiple Vitamin (MULTIVITAMIN) capsule Take 1 capsule by mouth daily.    Marland Kitchen omeprazole (PRILOSEC) 10 MG capsule 20 mg daily.    . Probiotic Product (PROBIOTIC-10) CAPS      No current facility-administered medications on file prior to visit.     No Known Allergies  No family history on file.  BP 110/80 (BP Location: Right Arm, Patient Position: Sitting, Cuff Size: Normal)   Pulse (!) 105   Temp 98.1 F (36.7 C) (Oral)   Ht 6' (1.829 m)   Wt 235 lb (106.6 kg)   SpO2  97%   BMI 31.87 kg/m    Review of Systems denies headache, fever, palpitations, anxiety, rash, visual loss, abdominal pain, sob, urinary frequency, arthralgias, gynecomastia, cramps, cold intolerance, muscle weakness, n/v, rhinorrhea,  easy bruising, numbness, seizure, or LOC.  He has gained weight.  He has intermitt diarrhea.      Objective:   Physical Exam VS: see vs page GEN: no distress HEAD: head: no deformity eyes: no periorbital swelling, no proptosis external nose and ears are normal mouth: no lesion seen NECK: a healed scar is present.  I do not appreciate a nodule in the thyroid or elsewhere in the neck CHEST WALL: no deformity LUNGS: clear to auscultation CV: reg rate and rhythm, no murmur ABD: abdomen is soft, nontender.  no hepatosplenomegaly.  not distended.  no hernia MUSCULOSKELETAL: muscle bulk and strength are grossly normal.  no obvious joint swelling.  gait is normal and steady.  EXTEMITIES: no deformity.  no ulcer  on the feet.  feet are of normal color and temp.  no edema.  PULSES: dorsalis pedis intact bilat.  no carotid bruit NEURO:  cn 2-12 grossly intact.   readily moves all 4's.  sensation is intact to touch on the feet.   SKIN:  Normal texture and temperature.  No rash or suspicious lesion is visible.   NODES:  None palpable at the neck PSYCH: alert, well-oriented.  Does not appear anxious nor depressed.  outside test results are reviewed:  A1c=6.1% TSH=0.6  I have reviewed outside records, and summarized: Pt was noted to have hypoglycemia, and referred here.  He was noted to have sxs c/w hypoglycemia, and was given a cbg meter.       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  We discussed dysfunctional insulin production/release.  This is a strong risk factor for diabetes.    Patient Instructions  I have sent a prescription to your pharmacy, for metformin-XR. Please continue to check if you have symptoms Please come back for a follow-up appointment in 2 months.    Hypoglycemia Hypoglycemia occurs when the level of sugar (glucose) in the blood is too low. Glucose is a type of sugar that provides the body's main source of energy. Certain hormones (insulin and glucagon) control the level of glucose in the blood. Insulin lowers blood glucose, and glucagon increases blood glucose. Hypoglycemia can result from having too much insulin in the bloodstream, or from not eating enough food that contains glucose. Hypoglycemia can happen in people who do or do not have diabetes. It can develop quickly, and it can be a medical emergency. What are the causes? Hypoglycemia occurs most often in people who have diabetes. If you have diabetes, hypoglycemia may be caused by:  Diabetes medicine.  Not eating enough, or not eating often enough.  Increased physical activity.  Drinking alcohol, especially when you have not eaten recently.  If you do not have diabetes, hypoglycemia may be caused  by:  A tumor in the pancreas. The pancreas is the organ that makes insulin.  Not eating enough, or not eating for long periods at a time (fasting).  Severe infection or illness that affects the liver, heart, or kidneys.  Certain medicines.  You may also have reactive hypoglycemia. This condition causes hypoglycemia within 4 hours of eating a meal. This may occur after having stomach surgery. Sometimes, the cause of reactive hypoglycemia is not known. What increases the risk? Hypoglycemia is more likely to develop in:  People who have  diabetes and take medicines to lower blood glucose.  People who abuse alcohol.  People who have a severe illness.  What are the signs or symptoms? Hypoglycemia may not cause any symptoms. If you have symptoms, they may include:  Hunger.  Anxiety.  Sweating and feeling clammy.  Confusion.  Dizziness or feeling light-headed.  Sleepiness.  Nausea.  Increased heart rate.  Headache.  Blurry vision.  Seizure.  Nightmares.  Tingling or numbness around the mouth, lips, or tongue.  A change in speech.  Decreased ability to concentrate.  A change in coordination.  Restless sleep.  Tremors or shakes.  Fainting.  Irritability.  How is this diagnosed? Hypoglycemia is diagnosed with a blood test to measure your blood glucose level. This blood test is done while you are having symptoms. Your health care provider may also do a physical exam and review your medical history. If you do not have diabetes, other tests may be done to find the cause of your hypoglycemia. How is this treated? This condition can often be treated by immediately eating or drinking something that contains glucose, such as:  3-4 sugar tablets (glucose pills).  Glucose gel, 15-gram tube.  Fruit juice, 4 oz (120 mL).  Regular soda (not diet soda), 4 oz (120 mL).  Low-fat milk, 4 oz (120 mL).  Several pieces of hard candy.  Sugar or honey, 1  Tbsp.  Treating Severe Hypoglycemia Severe hypoglycemia is when your blood glucose level is at or below 54 mg/dL (3 mmol/L). Severe hypoglycemia is an emergency. Do not wait to see if the symptoms will go away. Get medical help right away. Call your local emergency services (911 in the [place] Do not drive yourself to the hospital. If you have severe hypoglycemia and you cannot eat or drink, you may need an injection of glucagon. A family member or close friend should learn how to check your blood glucose and how to give you a glucagon injection. Ask your health care provider if you need to have an emergency glucagon injection kit available. Severe hypoglycemia may need to be treated in a hospital. The treatment may include getting glucose through an IV tube. You may also need treatment for the cause of your hypoglycemia. Follow these instructions at home: General instructions  Avoid any diets that cause you to not eat enough food. Talk with your health care provider before you start any new diet.  Take over-the-counter and prescription medicines only as told by your health care provider.  Limit alcohol intake to no more than 1 drink per day for nonpregnant women and 2 drinks per day for men. One drink equals 12 oz of beer, 5 oz of wine, or 1 oz of hard liquor.  Keep all follow-up visits as told by your health care provider. This is important.  If You Have Reactive Hypoglycemia or Low Blood Sugar From Other Causes:  Monitor your blood glucose as told by your health care provider.  Follow instructions from your health care provider about eating or drinking restrictions. Contact a health care provider if:  You have problems keeping your blood glucose in your target range.  You have frequent episodes of hypoglycemia. Get help right away if:  You continue to have hypoglycemia symptoms after eating or drinking something containing glucose.  Your blood glucose is at or below 54 mg/dL (3  mmol/L).  You have a seizure.  You faint. These symptoms may represent a serious problem that is an emergency. Do not wait to  see if the symptoms will go away. Get medical help right away. Call your local emergency services (911 in the [place] Do not drive yourself to the hospital. This information is not intended to replace advice given to you by your health care provider. Make sure you discuss any questions you have with your health care provider. Document Released: 11/11/2005 Document Revised: 04/24/2016 Document Reviewed: 12/15/2015 Elsevier Interactive Patient Education  [employer].

## 2018-06-29 DIAGNOSIS — E162 Hypoglycemia, unspecified: Secondary | ICD-10-CM | POA: Insufficient documentation

## 2018-07-02 DIAGNOSIS — M542 Cervicalgia: Secondary | ICD-10-CM | POA: Diagnosis not present

## 2018-07-02 DIAGNOSIS — M25512 Pain in left shoulder: Secondary | ICD-10-CM | POA: Diagnosis not present

## 2018-08-31 ENCOUNTER — Encounter: Payer: Self-pay | Admitting: Endocrinology

## 2018-08-31 ENCOUNTER — Ambulatory Visit (INDEPENDENT_AMBULATORY_CARE_PROVIDER_SITE_OTHER): Payer: BLUE CROSS/BLUE SHIELD | Admitting: Endocrinology

## 2018-08-31 VITALS — BP 126/82 | HR 83 | Ht 72.0 in | Wt 234.4 lb

## 2018-08-31 DIAGNOSIS — E162 Hypoglycemia, unspecified: Secondary | ICD-10-CM

## 2018-08-31 LAB — POCT GLYCOSYLATED HEMOGLOBIN (HGB A1C): HEMOGLOBIN A1C: 5.4 % (ref 4.0–5.6)

## 2018-08-31 NOTE — Patient Instructions (Addendum)
Please continue the same metformin-XR. Please continue to check if you have symptoms.  Please come back for a follow-up appointment in 6-12 months.

## 2018-08-31 NOTE — Progress Notes (Signed)
Subjective:    Patient ID: Mason Mcgee, male    DOB: 1971-04-12, 47 y.o.   MRN: [id number]  HPI Pt returns for f/u of hyperglycemia, with a component of reactive hypoglycemia (rx'ed 2019; he was rx'ed metformin; he has never had pancreatic imaging).  Since on metformin, episodes of sxs of lightheadedness and tremor, are improved, but not resolved.  He checked cbg one occasion--was 91.   Past Medical History:  Diagnosis Date  . Hyperparathyroidism ([facility])   . Hypertension     Past Surgical History:  Procedure Laterality Date  . PARATHYROIDECTOMY      Social History   Socioeconomic History  . Marital status: Married    Spouse name: Not on file  . Number of children: Not on file  . Years of education: Not on file  . Highest education level: Not on file  Occupational History  . Not on file  Social Needs  . Financial resource strain: Not on file  . Food insecurity:    Worry: Not on file    Inability: Not on file  . Transportation needs:    Medical: Not on file    Non-medical: Not on file  Tobacco Use  . Smoking status: Never Smoker  . Smokeless tobacco: Never Used  Substance and Sexual Activity  . Alcohol use: Yes  . Drug use: No  . Sexual activity: Not on file  Lifestyle  . Physical activity:    Days per week: Not on file    Minutes per session: Not on file  . Stress: Not on file  Relationships  . Social connections:    Talks on phone: Not on file    Gets together: Not on file    Attends religious service: Not on file    Active member of club or organization: Not on file    Attends meetings of clubs or organizations: Not on file    Relationship status: Not on file  . Intimate partner violence:    Fear of current or ex partner: Not on file    Emotionally abused: Not on file    Physically abused: Not on file    Forced sexual activity: Not on file  Other Topics Concern  . Not on file  Social History Narrative  . Not on file    Current Outpatient  Medications on File Prior to Visit  Medication Sig Dispense Refill  . Cholecalciferol (VITAMIN D PO) Take by mouth.    . FIBER PO Take by mouth.    . Glucosamine HCl (GLUCOSAMINE PO) Take by mouth.    Marland Kitchen lisinopril (PRINIVIL,ZESTRIL) 10 MG tablet Take 10 mg by mouth daily.    . Loratadine 10 MG CAPS     . metFORMIN (GLUCOPHAGE-XR) 500 MG 24 hr tablet Take 1 tablet (500 mg total) by mouth daily with breakfast. 90 tablet 3  . Multiple Vitamin (MULTIVITAMIN) capsule Take 1 capsule by mouth daily.    Marland Kitchen omeprazole (PRILOSEC) 10 MG capsule 20 mg daily.    . Probiotic Product (PROBIOTIC-10) CAPS      No current facility-administered medications on file prior to visit.     No Known Allergies  No family history on file.  BP 126/82 (BP Location: Right Arm)   Pulse 83   Ht 6' (1.829 m)   Wt 234 lb 6.4 oz (106.3 kg)   SpO2 97%   BMI 31.79 kg/m   Review of Systems He has lost 1 lb since last ov  Objective:   Physical Exam VITAL SIGNS:  See vs page GENERAL: no distress Ext: no leg edema   Lab Results  Component Value Date   HGBA1C 5.4 08/31/2018       Assessment & Plan:  Reactive hypoglycemia: improved on rx.    Patient Instructions  Please continue the same metformin-XR. Please continue to check if you have symptoms.  Please come back for a follow-up appointment in 6-12 months.

## 2018-11-03 DIAGNOSIS — M79671 Pain in right foot: Secondary | ICD-10-CM | POA: Diagnosis not present
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
## 2018-12-07 DIAGNOSIS — M722 Plantar fascial fibromatosis: Secondary | ICD-10-CM | POA: Diagnosis not present

## 2018-12-28 DIAGNOSIS — Z0000 Encounter for general adult medical examination without abnormal findings: Secondary | ICD-10-CM | POA: Diagnosis not present

## 2018-12-28 DIAGNOSIS — E7800 Pure hypercholesterolemia, unspecified: Secondary | ICD-10-CM | POA: Diagnosis not present

## 2018-12-28 DIAGNOSIS — E213 Hyperparathyroidism, unspecified: Secondary | ICD-10-CM | POA: Diagnosis not present

## 2018-12-28 DIAGNOSIS — Z125 Encounter for screening for malignant neoplasm of prostate: Secondary | ICD-10-CM | POA: Diagnosis not present

## 2018-12-28 DIAGNOSIS — I10 Essential (primary) hypertension: Secondary | ICD-10-CM | POA: Diagnosis not present

## 2019-01-18 DIAGNOSIS — M79671 Pain in right foot: Secondary | ICD-10-CM | POA: Diagnosis not present

## 2019-01-18 DIAGNOSIS — M545 Low back pain: Secondary | ICD-10-CM | POA: Diagnosis not present

## 2019-01-18 DIAGNOSIS — M79605 Pain in left leg: Secondary | ICD-10-CM | POA: Diagnosis not present

## 2019-01-21 DIAGNOSIS — M79605 Pain in left leg: Secondary | ICD-10-CM | POA: Diagnosis not present

## 2019-01-21 DIAGNOSIS — M79671 Pain in right foot: Secondary | ICD-10-CM | POA: Diagnosis not present

## 2019-01-21 DIAGNOSIS — M545 Low back pain: Secondary | ICD-10-CM | POA: Diagnosis not present

## 2019-02-09 DIAGNOSIS — N529 Male erectile dysfunction, unspecified: Secondary | ICD-10-CM | POA: Diagnosis not present

## 2019-03-09 ENCOUNTER — Ambulatory Visit: Payer: BLUE CROSS/BLUE SHIELD | Admitting: Endocrinology

## 2019-05-04 ENCOUNTER — Ambulatory Visit (INDEPENDENT_AMBULATORY_CARE_PROVIDER_SITE_OTHER): Payer: BC Managed Care – PPO | Admitting: Endocrinology

## 2019-05-04 ENCOUNTER — Other Ambulatory Visit: Payer: Self-pay

## 2019-05-04 DIAGNOSIS — E162 Hypoglycemia, unspecified: Secondary | ICD-10-CM

## 2019-05-04 NOTE — Patient Instructions (Signed)
Please continue the same metformin-XR.   Please ask Dr Marisue Humble to check the a1c when you are there in August.   Please come back for a follow-up appointment in 6-12 months.

## 2019-05-04 NOTE — Progress Notes (Signed)
Subjective:    Patient ID: Mason Mcgee, male    DOB: 1971-03-14, 48 y.o.   MRN: [id number]  HPI  telehealth visit today via doxy video visit.  Alternatives to telehealth are presented to this patient, and the patient agrees to the telehealth visit. Pt is advised of the cost of the visit, and agrees to this, also.   Patient is at home, and I am at the office.   Persons attending the telehealth visit: the patient, wife, and I Pt returns for f/u of hyperglycemia, with a component of reactive hypoglycemia (rx'ed 2019; he was rx'ed metformin; he has never had pancreatic imaging; he has never had LOC).  Since last ov, he has had just a few milder episodes of slight tremor of the hands, and assoc diaphoresis.  He works delivering bread.  He wants to know if he should increase the metformin.   Past Medical History:  Diagnosis Date  . Hyperparathyroidism ([place])   . Hypertension     Past Surgical History:  Procedure Laterality Date  . PARATHYROIDECTOMY      Social History   Socioeconomic History  . Marital status: Married    Spouse name: Not on file  . Number of children: Not on file  . Years of education: Not on file  . Highest education level: Not on file  Occupational History  . Not on file  Social Needs  . Financial resource strain: Not on file  . Food insecurity:    Worry: Not on file    Inability: Not on file  . Transportation needs:    Medical: Not on file    Non-medical: Not on file  Tobacco Use  . Smoking status: Never Smoker  . Smokeless tobacco: Never Used  Substance and Sexual Activity  . Alcohol use: Yes  . Drug use: No  . Sexual activity: Not on file  Lifestyle  . Physical activity:    Days per week: Not on file    Minutes per session: Not on file  . Stress: Not on file  Relationships  . Social connections:    Talks on phone: Not on file    Gets together: Not on file    Attends religious service: Not on file    Active member of club or organization:  Not on file    Attends meetings of clubs or organizations: Not on file    Relationship status: Not on file  . Intimate partner violence:    Fear of current or ex partner: Not on file    Emotionally abused: Not on file    Physically abused: Not on file    Forced sexual activity: Not on file  Other Topics Concern  . Not on file  Social History Narrative  . Not on file    Current Outpatient Medications on File Prior to Visit  Medication Sig Dispense Refill  . Cholecalciferol (VITAMIN D PO) Take by mouth.    . FIBER PO Take by mouth.    . Glucosamine HCl (GLUCOSAMINE PO) Take by mouth.    Marland Kitchen lisinopril (PRINIVIL,ZESTRIL) 10 MG tablet Take 10 mg by mouth daily.    . Loratadine 10 MG CAPS     . metFORMIN (GLUCOPHAGE-XR) 500 MG 24 hr tablet Take 1 tablet (500 mg total) by mouth daily with breakfast. 90 tablet 3  . Multiple Vitamin (MULTIVITAMIN) capsule Take 1 capsule by mouth daily.    Marland Kitchen omeprazole (PRILOSEC) 10 MG capsule 20 mg daily.    Marland Kitchen  Probiotic Product (PROBIOTIC-10) CAPS      No current facility-administered medications on file prior to visit.     No Known Allergies  No family history on file.   Review of Systems Denies LOC and signif weight change.  He also has arthralgias.     Objective:   Physical Exam       Assessment & Plan:  Hypoglycemia: well-controlled Arthralgias: we discussed.  I told pt this is not related to metformin  Patient Instructions  Please continue the same metformin-XR.   Please ask Dr Manus GunningEhinger to check the a1c when you are there in August.   Please come back for a follow-up appointment in 6-12 months.

## 2019-05-14 ENCOUNTER — Encounter: Payer: Self-pay | Admitting: Endocrinology

## 2019-05-14 ENCOUNTER — Other Ambulatory Visit: Payer: Self-pay | Admitting: Endocrinology

## 2019-05-14 MED ORDER — CANAGLIFLOZIN 100 MG PO TABS: 100.0000 mg | ORAL_TABLET | Freq: Every day | ORAL | 0 refills | Status: DC

## 2019-05-14 NOTE — Telephone Encounter (Signed)
Please advise 

## 2019-05-27 DIAGNOSIS — M5442 Lumbago with sciatica, left side: Secondary | ICD-10-CM | POA: Diagnosis not present

## 2019-05-27 DIAGNOSIS — M545 Low back pain: Secondary | ICD-10-CM | POA: Diagnosis not present

## 2019-06-17 DIAGNOSIS — M545 Low back pain: Secondary | ICD-10-CM | POA: Diagnosis not present

## 2019-06-21 ENCOUNTER — Other Ambulatory Visit: Payer: Self-pay | Admitting: Endocrinology

## 2019-06-30 DIAGNOSIS — E213 Hyperparathyroidism, unspecified: Secondary | ICD-10-CM | POA: Diagnosis not present

## 2019-06-30 DIAGNOSIS — E7800 Pure hypercholesterolemia, unspecified: Secondary | ICD-10-CM | POA: Diagnosis not present

## 2019-06-30 DIAGNOSIS — I10 Essential (primary) hypertension: Secondary | ICD-10-CM | POA: Diagnosis not present

## 2019-06-30 DIAGNOSIS — E161 Other hypoglycemia: Secondary | ICD-10-CM | POA: Diagnosis not present

## 2019-07-07 DIAGNOSIS — E7800 Pure hypercholesterolemia, unspecified: Secondary | ICD-10-CM | POA: Diagnosis not present

## 2019-07-07 DIAGNOSIS — E161 Other hypoglycemia: Secondary | ICD-10-CM | POA: Diagnosis not present

## 2019-07-07 DIAGNOSIS — I10 Essential (primary) hypertension: Secondary | ICD-10-CM | POA: Diagnosis not present

## 2019-07-07 DIAGNOSIS — Z6832 Body mass index (BMI) 32.0-32.9, adult: Secondary | ICD-10-CM | POA: Diagnosis not present

## 2019-07-14 ENCOUNTER — Telehealth: Payer: Self-pay | Admitting: Endocrinology

## 2019-07-14 NOTE — Telephone Encounter (Signed)
Patients wife stated that the patients A1C results from the patients PCP should have been sent over to [place]. States it is high.  Please Advise, Thanks

## 2019-07-14 NOTE — Telephone Encounter (Signed)
Results have been received and placed on Dr. Cordelia Pen desk for review

## 2019-07-14 NOTE — Telephone Encounter (Signed)
Have you seen these labs? All fax machines and in-boxes are clear. Therefore, no new faxes containing lab results for this pt have been received.

## 2019-07-14 NOTE — Telephone Encounter (Signed)
please contact patient: A1c=6.2%.  That is higher.  I went over what you said when we had our virtual meeting.  This is a good a1c to shoot for.  Please continue the same metformin.  I'll see you next time.

## 2019-07-14 NOTE — Telephone Encounter (Signed)
It is prob is scanning

## 2019-07-15 NOTE — Telephone Encounter (Signed)
Called pt and informed of Dr. Cordelia Pen recommendations. Also scheduled for follow up according to Dr. Cordelia Pen note, f/u 6 mo.

## 2019-09-03 DIAGNOSIS — Z79899 Other long term (current) drug therapy: Secondary | ICD-10-CM | POA: Diagnosis not present

## 2019-09-03 DIAGNOSIS — E782 Mixed hyperlipidemia: Secondary | ICD-10-CM | POA: Diagnosis not present

## 2019-09-04 ENCOUNTER — Other Ambulatory Visit: Payer: Self-pay | Admitting: Endocrinology

## 2019-10-05 DIAGNOSIS — N529 Male erectile dysfunction, unspecified: Secondary | ICD-10-CM | POA: Diagnosis not present

## 2019-11-10 ENCOUNTER — Ambulatory Visit: Payer: BC Managed Care – PPO | Admitting: Endocrinology

## 2019-12-06 ENCOUNTER — Other Ambulatory Visit: Payer: Self-pay

## 2019-12-07 DIAGNOSIS — E7800 Pure hypercholesterolemia, unspecified: Secondary | ICD-10-CM | POA: Diagnosis not present

## 2019-12-08 ENCOUNTER — Ambulatory Visit: Payer: BC Managed Care – PPO | Admitting: Endocrinology

## 2019-12-08 ENCOUNTER — Other Ambulatory Visit: Payer: Self-pay

## 2019-12-08 ENCOUNTER — Encounter: Payer: Self-pay | Admitting: Endocrinology

## 2019-12-08 VITALS — BP 122/80 | HR 95 | Ht 72.0 in | Wt 233.8 lb

## 2019-12-08 DIAGNOSIS — E162 Hypoglycemia, unspecified: Secondary | ICD-10-CM

## 2019-12-08 LAB — POCT GLYCOSYLATED HEMOGLOBIN (HGB A1C): Hemoglobin A1C: 5.7 % — AB (ref 4.0–5.6)

## 2019-12-08 NOTE — Progress Notes (Signed)
Subjective:    Patient ID: Mason Mcgee, male    DOB: [birth year], 49 y.o.   MRN: [id number]  HPI Pt returns for f/u of hyperglycemia, with a component of reactive hypoglycemia (rx'ed 2019; he was rx'ed metformin; he has never had pancreatic imaging; he has never had LOC).  Pt says he seldom has any sxs.  He has occasional heat intolerance.  He works delivering bread.  He wants to know if he should increase the metformin.  Past Medical History:  Diagnosis Date  . Hyperparathyroidism ([facility])   . Hypertension     Past Surgical History:  Procedure Laterality Date  . PARATHYROIDECTOMY      Social History   Socioeconomic History  . Marital status: Married    Spouse name: Not on file  . Number of children: Not on file  . Years of education: Not on file  . Highest education level: Not on file  Occupational History  . Not on file  Tobacco Use  . Smoking status: Never Smoker  . Smokeless tobacco: Never Used  Substance and Sexual Activity  . Alcohol use: Yes  . Drug use: No  . Sexual activity: Not on file  Other Topics Concern  . Not on file  Social History Narrative  . Not on file   Social Determinants of Health   Financial Resource Strain:   . Difficulty of Paying Living Expenses: Not on file  Food Insecurity:   . Worried About Programme researcher, broadcasting/film/video in the Last Year: Not on file  . Ran Out of Food in the Last Year: Not on file  Transportation Needs:   . Lack of Transportation (Medical): Not on file  . Lack of Transportation (Non-Medical): Not on file  Physical Activity:   . Days of Exercise per Week: Not on file  . Minutes of Exercise per Session: Not on file  Stress:   . Feeling of Stress : Not on file  Social Connections:   . Frequency of Communication with Friends and Family: Not on file  . Frequency of Social Gatherings with Friends and Family: Not on file  . Attends Religious Services: Not on file  . Active Member of Clubs or Organizations: Not on file  .  Attends Banker Meetings: Not on file  . Marital Status: Not on file  Intimate Partner Violence:   . Fear of Current or Ex-Partner: Not on file  . Emotionally Abused: Not on file  . Physically Abused: Not on file  . Sexually Abused: Not on file    Current Outpatient Medications on File Prior to Visit  Medication Sig Dispense Refill  . canagliflozin (INVOKANA) 100 MG TABS tablet Take 1 tablet (100 mg total) by mouth daily before breakfast. 30 tablet 0  . Cholecalciferol (VITAMIN D PO) Take by mouth.    . FIBER PO Take by mouth.    . Glucosamine HCl (GLUCOSAMINE PO) Take by mouth.    Marland Kitchen lisinopril (PRINIVIL,ZESTRIL) 10 MG tablet Take 10 mg by mouth daily.    . Loratadine 10 MG CAPS     . metFORMIN (GLUCOPHAGE-XR) 500 MG 24 hr tablet Take 1 tablet by mouth once daily with breakfast 90 tablet 0  . Multiple Vitamin (MULTIVITAMIN) capsule Take 1 capsule by mouth daily.    Marland Kitchen omeprazole (PRILOSEC) 10 MG capsule 20 mg daily.    . Probiotic Product (PROBIOTIC-10) CAPS      No current facility-administered medications on file prior to visit.  No Known Allergies  No family history on file.  BP 122/80 (BP Location: Left Arm, Patient Position: Sitting, Cuff Size: Large)   Pulse 95   Ht 6' (1.829 m)   Wt 233 lb 12.8 oz (106.1 kg)   SpO2 98%   BMI 31.71 kg/m     Review of Systems Denies LOC    Objective:   Physical Exam VITAL SIGNS:  See vs page GENERAL: no distress Pulses: dorsalis pedis intact bilat.   MSK: no deformity of the feet CV: no leg edema Skin:  no ulcer on the feet.  normal color and temp on the feet. Neuro: sensation is intact to touch on the feet   Lab Results  Component Value Date   HGBA1C 5.7 (A) 12/08/2019       Assessment & Plan:  Hyperglycemia: stable Reactive hypoglycemia: well-controlled  Patient Instructions  Please continue the same metformin-XR.   Try checking the blood sugar is you have the hot feeling.   Please come back for a  follow-up appointment in 6-12 months.

## 2019-12-08 NOTE — Patient Instructions (Addendum)
Please continue the same metformin-XR.   Try checking the blood sugar is you have the hot feeling.   Please come back for a follow-up appointment in 6-12 months.

## 2019-12-23 DIAGNOSIS — Z20822 Contact with and (suspected) exposure to covid-19: Secondary | ICD-10-CM | POA: Diagnosis not present

## 2020-01-04 DIAGNOSIS — I10 Essential (primary) hypertension: Secondary | ICD-10-CM | POA: Diagnosis not present

## 2020-01-04 DIAGNOSIS — Z0000 Encounter for general adult medical examination without abnormal findings: Secondary | ICD-10-CM | POA: Diagnosis not present

## 2020-01-04 DIAGNOSIS — Z125 Encounter for screening for malignant neoplasm of prostate: Secondary | ICD-10-CM | POA: Diagnosis not present

## 2020-01-04 LAB — BASIC METABOLIC PANEL: Creatinine: 1.1 (ref <=1.3)

## 2020-01-15 DIAGNOSIS — M7502 Adhesive capsulitis of left shoulder: Secondary | ICD-10-CM | POA: Diagnosis not present

## 2020-01-25 DIAGNOSIS — M5441 Lumbago with sciatica, right side: Secondary | ICD-10-CM | POA: Diagnosis not present

## 2020-01-25 DIAGNOSIS — M5442 Lumbago with sciatica, left side: Secondary | ICD-10-CM | POA: Diagnosis not present

## 2020-01-25 DIAGNOSIS — M545 Low back pain: Secondary | ICD-10-CM | POA: Diagnosis not present

## 2020-01-26 ENCOUNTER — Other Ambulatory Visit: Payer: Self-pay | Admitting: Endocrinology

## 2020-01-31 DIAGNOSIS — M545 Low back pain: Secondary | ICD-10-CM | POA: Diagnosis not present

## 2020-02-10 DIAGNOSIS — M545 Low back pain: Secondary | ICD-10-CM | POA: Diagnosis not present

## 2020-02-10 DIAGNOSIS — M5442 Lumbago with sciatica, left side: Secondary | ICD-10-CM | POA: Diagnosis not present

## 2020-03-13 DIAGNOSIS — M545 Low back pain: Secondary | ICD-10-CM | POA: Diagnosis not present

## 2020-04-13 DIAGNOSIS — S30861A Insect bite (nonvenomous) of abdominal wall, initial encounter: Secondary | ICD-10-CM | POA: Diagnosis not present

## 2020-04-13 DIAGNOSIS — R21 Rash and other nonspecific skin eruption: Secondary | ICD-10-CM | POA: Diagnosis not present

## 2020-04-25 ENCOUNTER — Other Ambulatory Visit: Payer: Self-pay | Admitting: Endocrinology

## 2020-05-23 DIAGNOSIS — N5201 Erectile dysfunction due to arterial insufficiency: Secondary | ICD-10-CM | POA: Diagnosis not present

## 2020-06-12 ENCOUNTER — Other Ambulatory Visit: Payer: Self-pay

## 2020-06-12 ENCOUNTER — Ambulatory Visit: Payer: BC Managed Care – PPO | Admitting: Endocrinology

## 2020-06-12 VITALS — BP 120/76 | HR 95 | Ht 72.01 in | Wt 239.2 lb

## 2020-06-12 DIAGNOSIS — E162 Hypoglycemia, unspecified: Secondary | ICD-10-CM

## 2020-06-12 LAB — POCT GLYCOSYLATED HEMOGLOBIN (HGB A1C): Hemoglobin A1C: 5.5 % (ref 4.0–5.6)

## 2020-06-12 LAB — TSH: TSH: 1.56 u[IU]/mL (ref 0.35–4.50)

## 2020-06-12 NOTE — Patient Instructions (Addendum)
Blood tests are requested for you today.  We'll let you know about the results.  Please continue the same medications. Please come back for a follow-up appointment in 6 months.   

## 2020-06-12 NOTE — Progress Notes (Signed)
Subjective:    Patient ID: Mason Mcgee, male    DOB: January 30, 1971, 49 y.o.   MRN: [id number]  HPI Pt returns for f/u of hyperglycemia, with a component of reactive hypoglycemia (rx'ed 2019; he takes Invokana and metformin; he has never had pancreatic imaging; he has never had LOC).  pt states he feels well in general, except for rare intermitt heat intolerance.  He works delivering bread.   Past Medical History:  Diagnosis Date  . Hyperparathyroidism (HCC)   . Hypertension     Past Surgical History:  Procedure Laterality Date  . PARATHYROIDECTOMY      Social History   Socioeconomic History  . Marital status: Married    Spouse name: Not on file  . Number of children: Not on file  . Years of education: Not on file  . Highest education level: Not on file  Occupational History  . Not on file  Tobacco Use  . Smoking status: Never Smoker  . Smokeless tobacco: Never Used  Vaping Use  . Vaping Use: Never used  Substance and Sexual Activity  . Alcohol use: Yes  . Drug use: No  . Sexual activity: Not on file  Other Topics Concern  . Not on file  Social History Narrative  . Not on file   Social Determinants of Health   Financial Resource Strain:   . Difficulty of Paying Living Expenses:   Food Insecurity:   . Worried About Programme researcher, broadcasting/film/video in the Last Year:   . Barista in the Last Year:   Transportation Needs:   . Freight forwarder (Medical):   Marland Kitchen Lack of Transportation (Non-Medical):   Physical Activity:   . Days of Exercise per Week:   . Minutes of Exercise per Session:   Stress:   . Feeling of Stress :   Social Connections:   . Frequency of Communication with Friends and Family:   . Frequency of Social Gatherings with Friends and Family:   . Attends Religious Services:   . Active Member of Clubs or Organizations:   . Attends Banker Meetings:   Marland Kitchen Marital Status:   Intimate Partner Violence:   . Fear of Current or Ex-Partner:   .  Emotionally Abused:   Marland Kitchen Physically Abused:   . Sexually Abused:     Current Outpatient Medications on File Prior to Visit  Medication Sig Dispense Refill  . canagliflozin (INVOKANA) 100 MG TABS tablet Take 1 tablet (100 mg total) by mouth daily before breakfast. 30 tablet 0  . metFORMIN (GLUCOPHAGE-XR) 500 MG 24 hr tablet Take 1 tablet by mouth once daily with breakfast 90 tablet 0  . Cholecalciferol (VITAMIN D PO) Take by mouth.    . FIBER PO Take by mouth.    . Glucosamine HCl (GLUCOSAMINE PO) Take by mouth.    Marland Kitchen lisinopril (PRINIVIL,ZESTRIL) 10 MG tablet Take 10 mg by mouth daily.    . Loratadine 10 MG CAPS     . Multiple Vitamin (MULTIVITAMIN) capsule Take 1 capsule by mouth daily.    Marland Kitchen omeprazole (PRILOSEC) 10 MG capsule 20 mg daily.    . Probiotic Product (PROBIOTIC-10) CAPS      No current facility-administered medications on file prior to visit.    No Known Allergies  No family history on file.  BP 120/76 (BP Location: Left Arm, Patient Position: Sitting)   Pulse 95   Ht 6' 0.01" (1.829 m)   Wt 239 lb 3.2  oz (108.5 kg)   SpO2 98%   BMI 32.43 kg/m    Review of Systems He has gained a few lbs.      Objective:   Physical Exam VITAL SIGNS:  See vs page GENERAL: no distress EXT: no leg edema.     Lab Results  Component Value Date   HGBA1C 5.5 06/12/2020       Assessment & Plan:  hyperglycemia, with a component of reactive hypoglycemia.  well-controlled  Patient Instructions  Blood tests are requested for you today.  We'll let you know about the results.   Please continue the same medications.   Please come back for a follow-up appointment in 6 months.

## 2020-06-21 DIAGNOSIS — N5201 Erectile dysfunction due to arterial insufficiency: Secondary | ICD-10-CM | POA: Diagnosis not present

## 2020-06-27 ENCOUNTER — Other Ambulatory Visit: Payer: Self-pay | Admitting: Endocrinology

## 2020-06-28 DIAGNOSIS — E7800 Pure hypercholesterolemia, unspecified: Secondary | ICD-10-CM | POA: Diagnosis not present

## 2020-06-28 DIAGNOSIS — E161 Other hypoglycemia: Secondary | ICD-10-CM | POA: Diagnosis not present

## 2020-06-28 DIAGNOSIS — I10 Essential (primary) hypertension: Secondary | ICD-10-CM | POA: Diagnosis not present

## 2020-06-28 DIAGNOSIS — E213 Hyperparathyroidism, unspecified: Secondary | ICD-10-CM | POA: Diagnosis not present

## 2020-08-14 DIAGNOSIS — D485 Neoplasm of uncertain behavior of skin: Secondary | ICD-10-CM | POA: Diagnosis not present

## 2020-08-14 DIAGNOSIS — D229 Melanocytic nevi, unspecified: Secondary | ICD-10-CM | POA: Diagnosis not present

## 2020-08-14 DIAGNOSIS — H61002 Unspecified perichondritis of left external ear: Secondary | ICD-10-CM | POA: Diagnosis not present

## 2020-08-14 DIAGNOSIS — L905 Scar conditions and fibrosis of skin: Secondary | ICD-10-CM | POA: Diagnosis not present

## 2020-08-14 DIAGNOSIS — L573 Poikiloderma of Civatte: Secondary | ICD-10-CM | POA: Diagnosis not present

## 2020-08-14 DIAGNOSIS — L2084 Intrinsic (allergic) eczema: Secondary | ICD-10-CM | POA: Diagnosis not present

## 2020-08-14 DIAGNOSIS — L820 Inflamed seborrheic keratosis: Secondary | ICD-10-CM | POA: Diagnosis not present

## 2020-09-25 ENCOUNTER — Other Ambulatory Visit: Payer: Self-pay | Admitting: Endocrinology

## 2020-10-31 ENCOUNTER — Other Ambulatory Visit: Payer: Self-pay | Admitting: Endocrinology

## 2020-12-08 ENCOUNTER — Other Ambulatory Visit: Payer: Self-pay

## 2020-12-12 ENCOUNTER — Other Ambulatory Visit: Payer: Self-pay

## 2020-12-12 ENCOUNTER — Ambulatory Visit (INDEPENDENT_AMBULATORY_CARE_PROVIDER_SITE_OTHER): Payer: 59 | Admitting: Endocrinology

## 2020-12-12 VITALS — BP 124/80 | HR 88 | Ht 72.0 in | Wt 230.6 lb

## 2020-12-12 DIAGNOSIS — E162 Hypoglycemia, unspecified: Secondary | ICD-10-CM

## 2020-12-12 LAB — POCT GLYCOSYLATED HEMOGLOBIN (HGB A1C): Hemoglobin A1C: 5.7 % — AB (ref 4.0–5.6)

## 2020-12-12 LAB — BASIC METABOLIC PANEL
BUN: 8 mg/dL (ref 6–23)
CO2: 27 mEq/L (ref 19–32)
Calcium: 10 mg/dL (ref 8.4–10.5)
Chloride: 101 mEq/L (ref 96–112)
Creatinine, Ser: 1.03 mg/dL (ref 0.40–1.50)
GFR: 86.76 mL/min (ref >60.00)
Glucose, Bld: 101 mg/dL — ABNORMAL HIGH (ref 70–99)
Potassium: 3.9 mEq/L (ref 3.5–5.1)
Sodium: 136 mEq/L (ref 135–145)

## 2020-12-12 LAB — TSH: TSH: 0.89 u[IU]/mL (ref 0.35–4.50)

## 2020-12-12 NOTE — Patient Instructions (Addendum)
Blood tests are requested for you today.  We'll let you know about the results.   Please continue the same medications.   Please come back for a follow-up appointment in 1 year.

## 2020-12-12 NOTE — Progress Notes (Signed)
Subjective:    Patient ID: Mason Mcgee, male    DOB: [birth year], 50 y.o.   MRN: [id number]  HPI Pt returns for f/u of hyperglycemia, with a component of reactive hypoglycemia (rx'ed 2019; he takes Invokana and metformin; he has never had pancreatic imaging; he has never had LOC; he works delivering bread).  pt states he feels well in general.  Specifically, no hypoglycemia sxs.   Past Medical History:  Diagnosis Date  . Hyperparathyroidism (HCC)   . Hypertension     Past Surgical History:  Procedure Laterality Date  . PARATHYROIDECTOMY      Social History   Socioeconomic History  . Marital status: Married    Spouse name: Not on file  . Number of children: Not on file  . Years of education: Not on file  . Highest education level: Not on file  Occupational History  . Not on file  Tobacco Use  . Smoking status: Never Smoker  . Smokeless tobacco: Never Used  Vaping Use  . Vaping Use: Never used  Substance and Sexual Activity  . Alcohol use: Yes  . Drug use: No  . Sexual activity: Not on file  Other Topics Concern  . Not on file  Social History Narrative  . Not on file   Social Determinants of Health   Financial Resource Strain: Not on file  Food Insecurity: Not on file  Transportation Needs: Not on file  Physical Activity: Not on file  Stress: Not on file  Social Connections: Not on file  Intimate Partner Violence: Not on file    Current Outpatient Medications on File Prior to Visit  Medication Sig Dispense Refill  . canagliflozin (INVOKANA) 100 MG TABS tablet Take 1 tablet (100 mg total) by mouth daily before breakfast. 30 tablet 0  . Cholecalciferol (VITAMIN D PO) Take by mouth.    . FIBER PO Take by mouth.    . Glucosamine HCl (GLUCOSAMINE PO) Take by mouth.    Marland Kitchen lisinopril (PRINIVIL,ZESTRIL) 10 MG tablet Take 10 mg by mouth daily.    . Loratadine 10 MG CAPS     . metFORMIN (GLUCOPHAGE-XR) 500 MG 24 hr tablet Take 1 tablet by mouth once daily with  breakfast 90 tablet 0  . Multiple Vitamin (MULTIVITAMIN) capsule Take 1 capsule by mouth daily.    Marland Kitchen omeprazole (PRILOSEC) 10 MG capsule 20 mg daily.    . Probiotic Product (PROBIOTIC-10) CAPS      No current facility-administered medications on file prior to visit.    No Known Allergies  No family history on file.  BP 124/80 (BP Location: Right Arm, Patient Position: Sitting, Cuff Size: Normal)   Pulse 88   Ht 6' (1.829 m)   Wt 230 lb 9.6 oz (104.6 kg)   SpO2 99%   BMI 31.27 kg/m   Review of Systems He has lost 9 lbs since last ov.      Objective:   Physical Exam VITAL SIGNS:  See vs page.   GENERAL: no distress.   Pulses: dorsalis pedis intact bilat.   MSK: no deformity of the feet.  CV: no leg edema.   Skin:  no ulcer on the feet.  normal color and temp on the feet.   Neuro: sensation is intact to touch on the feet.     Lab Results  Component Value Date   HGBA1C 5.7 (A) 12/12/2020       Assessment & Plan:  Hyperglycemia, with hypoglycemic episodes: well-controlled.  Patient Instructions  Blood tests are requested for you today.  We'll let you know about the results.   Please continue the same medications.   Please come back for a follow-up appointment in 1 year.

## 2021-04-11 ENCOUNTER — Other Ambulatory Visit: Payer: Self-pay | Admitting: Endocrinology

## 2021-07-10 ENCOUNTER — Other Ambulatory Visit: Payer: Self-pay | Admitting: Endocrinology

## 2021-10-08 ENCOUNTER — Other Ambulatory Visit: Payer: Self-pay | Admitting: Endocrinology

## 2021-12-18 ENCOUNTER — Other Ambulatory Visit: Payer: Self-pay

## 2021-12-18 ENCOUNTER — Ambulatory Visit (INDEPENDENT_AMBULATORY_CARE_PROVIDER_SITE_OTHER): Payer: 59 | Admitting: Endocrinology

## 2021-12-18 VITALS — BP 144/90 | HR 100 | Ht 72.0 in | Wt 243.4 lb

## 2021-12-18 DIAGNOSIS — E162 Hypoglycemia, unspecified: Secondary | ICD-10-CM | POA: Diagnosis not present

## 2021-12-18 LAB — POCT GLYCOSYLATED HEMOGLOBIN (HGB A1C): Hemoglobin A1C: 5.6 % (ref 4.0–5.6)

## 2021-12-18 NOTE — Progress Notes (Signed)
° °  Subjective:    Patient ID: Mason Mcgee, male    DOB: [birth year], 51 y.o.   MRN: [id number]  HPI Pt returns for f/u of hyperglycemia, with a component of reactive hypoglycemia (rx'ed 2019; he takes metformin; he has never had pancreatic imaging; he has never had LOC).  pt states he feels well in general.  Specifically, seldom has hypoglycemia sxs.  He takes metformin only.   Past Medical History:  Diagnosis Date   Hyperparathyroidism ([place])    Hypertension     Past Surgical History:  Procedure Laterality Date   PARATHYROIDECTOMY      Social History   Socioeconomic History   Marital status: Married    Spouse name: Not on file   Number of children: Not on file   Years of education: Not on file   Highest education level: Not on file  Occupational History   Not on file  Tobacco Use   Smoking status: Never   Smokeless tobacco: Never  Vaping Use   Vaping Use: Never used  Substance and Sexual Activity   Alcohol use: Yes   Drug use: No   Sexual activity: Not on file  Other Topics Concern   Not on file  Social History Narrative   Not on file   Social Determinants of Health   Financial Resource Strain: Not on file  Food Insecurity: Not on file  Transportation Needs: Not on file  Physical Activity: Not on file  Stress: Not on file  Social Connections: Not on file  Intimate Partner Violence: Not on file    Current Outpatient Medications on File Prior to Visit  Medication Sig Dispense Refill   Cholecalciferol (VITAMIN D PO) Take by mouth.     FIBER PO Take by mouth.     Glucosamine HCl (GLUCOSAMINE PO) Take by mouth.     lisinopril (PRINIVIL,ZESTRIL) 10 MG tablet Take 10 mg by mouth daily.     Loratadine 10 MG CAPS      metFORMIN (GLUCOPHAGE-XR) 500 MG 24 hr tablet Take 1 tablet by mouth once daily with breakfast 90 tablet 0   Multiple Vitamin (MULTIVITAMIN) capsule Take 1 capsule by mouth daily.     omeprazole (PRILOSEC) 10 MG capsule 20 mg daily.     Probiotic  Product (PROBIOTIC-10) CAPS      No current facility-administered medications on file prior to visit.    No Known Allergies  No family history on file.  BP (!) 144/90    Pulse 100    Ht 6' (1.829 m)    Wt 243 lb 6.4 oz (110.4 kg)    SpO2 97%    BMI 33.01 kg/m    Review of Systems     Objective:   Physical Exam    outside test results are reviewed: A1c=6.2%  Lab Results  Component Value Date   HGBA1C 5.7 (A) 12/12/2020      Assessment & Plan:  Hypoglycemia, well-controlled.  We discussed options.  He chooses to d/c rx on a trial basis.    Patient Instructions  Please check the BP at home, and call Dr Marisue Humble if it stays high.   You can stop taking the metformin.   Please come back for a follow-up appointment in 6 months.

## 2021-12-18 NOTE — Patient Instructions (Addendum)
Please check the BP at home, and call Dr Marisue Humble if it stays high.   You can stop taking the metformin.   Please come back for a follow-up appointment in 6 months.

## 2022-01-06 ENCOUNTER — Other Ambulatory Visit: Payer: Self-pay | Admitting: Endocrinology

## 2022-01-11 DIAGNOSIS — Z125 Encounter for screening for malignant neoplasm of prostate: Secondary | ICD-10-CM | POA: Diagnosis not present

## 2022-01-11 DIAGNOSIS — I10 Essential (primary) hypertension: Secondary | ICD-10-CM | POA: Diagnosis not present

## 2022-01-11 DIAGNOSIS — E7800 Pure hypercholesterolemia, unspecified: Secondary | ICD-10-CM | POA: Diagnosis not present

## 2022-04-11 ENCOUNTER — Ambulatory Visit: Payer: 59 | Admitting: Endocrinology

## 2022-06-28 ENCOUNTER — Other Ambulatory Visit: Payer: 59

## 2022-07-03 ENCOUNTER — Ambulatory Visit (INDEPENDENT_AMBULATORY_CARE_PROVIDER_SITE_OTHER): Payer: 59 | Admitting: Endocrinology

## 2022-07-03 ENCOUNTER — Encounter: Payer: Self-pay | Admitting: Endocrinology

## 2022-07-03 VITALS — BP 130/80 | HR 92 | Ht 72.0 in | Wt 247.0 lb

## 2022-07-03 DIAGNOSIS — E161 Other hypoglycemia: Secondary | ICD-10-CM | POA: Diagnosis not present

## 2022-07-03 DIAGNOSIS — R7301 Impaired fasting glucose: Secondary | ICD-10-CM | POA: Diagnosis not present

## 2022-07-03 LAB — POCT GLYCOSYLATED HEMOGLOBIN (HGB A1C): Hemoglobin A1C: 5.9 % — AB (ref 4.0–5.6)

## 2022-07-03 LAB — POCT GLUCOSE (DEVICE FOR HOME USE): POC Glucose: 116 mg/dl — AB (ref 70–99)

## 2022-07-03 NOTE — Progress Notes (Signed)
Patient ID: Mason Mcgee, male   DOB: [birth year], 51 y.o.   MRN: [id number]           Chief complaint: Hypoglycemia  History of Present Illness:  For several years the patient apparently has had symptoms of episodic shakiness, sweating, hot flashes frequently occurring sometimes after eating Because of these symptoms his PCP referred him for endocrinology evaluation in 2022 Apparently blood sugars during those episodes were between 64 and 80 He did not have any diabetes and his highest A1c prior to his referral was 6.1  He was advised to modify his diet with adding more protein and cutting back on sweets and carbohydrates With this he has had significant improvement in his symptoms  However he says that a few months ago when he was having symptoms this would occur mostly within 30 minutes of eating lunch and would be generally worse he will be was more physically active Symptoms would not occur on waking up, after breakfast or dinner He would relieve his symptoms by eating some food or carbohydrate  He was also given metformin 500 mg last year This was stopped in 11/2021 when the patient felt he was having side effects He says with stopping the metformin he has not had insomnia and feels better overall He has never checked his blood sugars at home  Currently he feels well However he thinks he is gaining weight from getting more sweets, breads and not controlling portions as well as not exercising  Wt Readings from Last 3 Encounters:  07/03/22 247 lb (112 kg)  12/18/21 243 lb 6.4 oz (110.4 kg)  12/12/20 230 lb 9.6 oz (104.6 kg)   Lab Results  Component Value Date   HGBA1C 5.9 (A) 07/03/2022   HGBA1C 5.6 12/18/2021   HGBA1C 5.7 (A) 12/12/2020   Lab Results  Component Value Date   CREATININE 1.03 12/12/2020     Allergies as of 07/03/2022   No Known Allergies      Medication List        Accurate as of July 03, 2022  9:33 PM. If you have any questions, ask your nurse  or doctor.          FIBER PO Take by mouth.   GLUCOSAMINE PO Take by mouth.   lisinopril 10 MG tablet Commonly known as: ZESTRIL Take 10 mg by mouth daily.   Loratadine 10 MG Caps   metFORMIN 500 MG 24 hr tablet Commonly known as: GLUCOPHAGE-XR Take 1 tablet by mouth once daily with breakfast   multivitamin capsule Take 1 capsule by mouth daily.   omeprazole 10 MG capsule Commonly known as: PRILOSEC 20 mg daily.   Probiotic-10 Caps   rosuvastatin 5 MG tablet Commonly known as: CRESTOR Take 5 mg by mouth daily.   VITAMIN D PO Take by mouth.        Allergies: No Known Allergies  Past Medical History:  Diagnosis Date   Hyperparathyroidism (HCC)    Hypertension     Past Surgical History:  Procedure Laterality Date   PARATHYROIDECTOMY      Family History  Problem Relation Age of Onset   Diabetes Other     Social History:  reports that he has never smoked. He has never used smokeless tobacco. He reports current alcohol use. He reports that he does not use drugs.   Office Visit on 07/03/2022  Component Date Value Ref Range Status   Hemoglobin A1C 07/03/2022 5.9 (A)  4.0 - 5.6 % Final  POC Glucose 07/03/2022 116 (A)  70 - 99 mg/dl Final   Review of systems:  His current medications include omeprazole for reflux, Crestor for hypercholesterolemia and lisinopril for hypertension  He had parathyroidectomy in 2010 for hyperparathyroidism  EXAM:  BP 130/80 (BP Location: Left Arm, Patient Position: Sitting, Cuff Size: Large)   Pulse 92   Ht 6' (1.829 m)   Wt 247 lb (112 kg)   SpO2 97%   BMI 33.50 kg/m   Exam not indicated  Assessment/Plan:   REACTIVE hypoglycemia:  He may have had prediabetes along with 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 This has been generally well-controlled with reducing carbohydrates and adding protein to each meal Although he has not had significant symptoms lately thinks he gets more  symptoms with eating more carbohydrates at lunch and then getting active physically  Patient thinks that he can start getting back on his diet with cutting back on carbohydrates, portions and increasing protein as well as an exercise program  Currently his A1c is still in the prediabetic range and he will follow-up with his PCP for fasting glucose Discussed importance of diet and exercise and preventing diabetes  Follow-up as needed  Reather Littler 07/03/2022, 9:33 PM

## 2022-07-11 DIAGNOSIS — E7800 Pure hypercholesterolemia, unspecified: Secondary | ICD-10-CM | POA: Diagnosis not present

## 2022-07-11 DIAGNOSIS — I10 Essential (primary) hypertension: Secondary | ICD-10-CM | POA: Diagnosis not present

## 2022-11-20 DIAGNOSIS — M542 Cervicalgia: Secondary | ICD-10-CM | POA: Diagnosis not present

## 2023-01-20 DIAGNOSIS — K219 Gastro-esophageal reflux disease without esophagitis: Secondary | ICD-10-CM | POA: Diagnosis not present

## 2023-05-01 DIAGNOSIS — I10 Essential (primary) hypertension: Secondary | ICD-10-CM | POA: Diagnosis not present

## 2023-05-01 DIAGNOSIS — Z86010 Personal history of colonic polyps: Secondary | ICD-10-CM | POA: Diagnosis not present

## 2023-06-11 DIAGNOSIS — D2239 Melanocytic nevi of other parts of face: Secondary | ICD-10-CM | POA: Diagnosis not present

## 2023-06-11 DIAGNOSIS — Z7189 Other specified counseling: Secondary | ICD-10-CM | POA: Diagnosis not present

## 2023-06-11 DIAGNOSIS — Z85828 Personal history of other malignant neoplasm of skin: Secondary | ICD-10-CM | POA: Diagnosis not present

## 2023-06-11 DIAGNOSIS — Z08 Encounter for follow-up examination after completed treatment for malignant neoplasm: Secondary | ICD-10-CM | POA: Diagnosis not present

## 2023-06-11 DIAGNOSIS — D485 Neoplasm of uncertain behavior of skin: Secondary | ICD-10-CM | POA: Diagnosis not present

## 2023-06-11 DIAGNOSIS — L814 Other melanin hyperpigmentation: Secondary | ICD-10-CM | POA: Diagnosis not present

## 2023-06-11 DIAGNOSIS — L570 Actinic keratosis: Secondary | ICD-10-CM | POA: Diagnosis not present

## 2023-06-11 DIAGNOSIS — L821 Other seborrheic keratosis: Secondary | ICD-10-CM | POA: Diagnosis not present

## 2023-06-11 DIAGNOSIS — L989 Disorder of the skin and subcutaneous tissue, unspecified: Secondary | ICD-10-CM | POA: Diagnosis not present

## 2023-07-23 DIAGNOSIS — E161 Other hypoglycemia: Secondary | ICD-10-CM | POA: Diagnosis not present

## 2023-07-23 DIAGNOSIS — Z23 Encounter for immunization: Secondary | ICD-10-CM | POA: Diagnosis not present

## 2023-07-23 DIAGNOSIS — R7303 Prediabetes: Secondary | ICD-10-CM | POA: Diagnosis not present

## 2023-07-23 DIAGNOSIS — K219 Gastro-esophageal reflux disease without esophagitis: Secondary | ICD-10-CM | POA: Diagnosis not present

## 2023-07-23 DIAGNOSIS — E7800 Pure hypercholesterolemia, unspecified: Secondary | ICD-10-CM | POA: Diagnosis not present

## 2023-07-23 DIAGNOSIS — I10 Essential (primary) hypertension: Secondary | ICD-10-CM | POA: Diagnosis not present

## 2023-07-23 DIAGNOSIS — E213 Hyperparathyroidism, unspecified: Secondary | ICD-10-CM | POA: Diagnosis not present

## 2023-08-18 DIAGNOSIS — D485 Neoplasm of uncertain behavior of skin: Secondary | ICD-10-CM | POA: Diagnosis not present

## 2023-08-18 DIAGNOSIS — L905 Scar conditions and fibrosis of skin: Secondary | ICD-10-CM | POA: Diagnosis not present

## 2023-08-18 DIAGNOSIS — L570 Actinic keratosis: Secondary | ICD-10-CM | POA: Diagnosis not present

## 2023-09-23 DIAGNOSIS — Z48817 Encounter for surgical aftercare following surgery on the skin and subcutaneous tissue: Secondary | ICD-10-CM | POA: Diagnosis not present

## 2024-06-21 ENCOUNTER — Other Ambulatory Visit: Payer: Self-pay | Admitting: Orthopedic Surgery

## 2024-06-21 DIAGNOSIS — M545 Low back pain, unspecified: Secondary | ICD-10-CM

## 2024-06-25 ENCOUNTER — Ambulatory Visit
Admission: RE | Admit: 2024-06-25 | Discharge: 2024-06-25 | Disposition: A | Discharge status: Home / Self Care | Source: Ambulatory Visit | Attending: Orthopedic Surgery | Admitting: Orthopedic Surgery

## 2024-06-25 DIAGNOSIS — M545 Low back pain, unspecified: Secondary | ICD-10-CM

## 2024-07-13 ENCOUNTER — Other Ambulatory Visit: Payer: Self-pay | Admitting: Physical Medicine and Rehabilitation

## 2024-07-13 DIAGNOSIS — M5416 Radiculopathy, lumbar region: Secondary | ICD-10-CM

## 2024-07-16 ENCOUNTER — Other Ambulatory Visit

## 2024-07-22 ENCOUNTER — Encounter: Payer: Self-pay | Admitting: Physical Medicine and Rehabilitation

## 2024-08-24 ENCOUNTER — Other Ambulatory Visit: Payer: Self-pay | Admitting: Family Medicine

## 2024-08-24 ENCOUNTER — Ambulatory Visit
Admission: RE | Admit: 2024-08-24 | Discharge: 2024-08-24 | Disposition: A | Discharge status: Home / Self Care | Source: Ambulatory Visit | Attending: Family Medicine | Admitting: Family Medicine

## 2024-08-24 DIAGNOSIS — M25571 Pain in right ankle and joints of right foot: Secondary | ICD-10-CM

## 2024-10-10 ENCOUNTER — Ambulatory Visit

## 2024-10-10 ENCOUNTER — Ambulatory Visit
Admission: EM | Admit: 2024-10-10 | Discharge: 2024-10-10 | Disposition: A | Discharge status: Home / Self Care | Attending: Family Medicine | Admitting: Family Medicine

## 2024-10-10 DIAGNOSIS — W5503XA Scratched by cat, initial encounter: Secondary | ICD-10-CM | POA: Diagnosis not present

## 2024-10-10 DIAGNOSIS — M79672 Pain in left foot: Secondary | ICD-10-CM | POA: Diagnosis not present

## 2024-10-10 MED ORDER — AMOXICILLIN-POT CLAVULANATE 875-125 MG PO TABS: 1.0000 | ORAL_TABLET | Freq: Two times a day (BID) | ORAL | 0 refills | Status: DC

## 2024-10-10 NOTE — Discharge Instructions (Signed)
 We will let you know if anything comes back abnormal on the foot x-ray.  I have sent in a course of antibiotics just in case this is related to the recent cat scratch that you received to the area of pain.  Warm Epsom salt soaks, elevation, ice, over-the-counter pain relievers additionally.  Follow-up with foot specialist if not resolving.

## 2024-10-10 NOTE — ED Provider Notes (Signed)
 [facility]    CSN: [id number] Arrival date & time: 10/10/24  1211      History   Chief Complaint No chief complaint on file.   HPI Mason Mcgee is a 53 y.o. male.   Patient presenting today with left dorsal foot pain x 2 days.  Denies discoloration, bruising, swelling, numbness, tingling, loss of range of motion.  Notes the pain is directly in an area where a feral cat scratched him about 1 to 2 weeks ago but the area seems to be healing without issue.  Of note, did recently fracture of the other foot and is wondering if overcompensation may be causing his foot pain.  So far trying over-the-counter remedies with minimal relief.    Past Medical History:  Diagnosis Date   Hyperparathyroidism    Hypertension     Patient Active Problem List   Diagnosis Date Noted   Hypoglycemia 06/29/2018    Past Surgical History:  Procedure Laterality Date   PARATHYROIDECTOMY         Home Medications    Prior to Admission medications   Medication Sig Start Date End Date Taking? Authorizing Provider  amoxicillin-clavulanate (AUGMENTIN) 875-125 MG tablet Take 1 tablet by mouth every 12 (twelve) hours. 10/10/24  Yes Stuart Vernell Norris, PA-C  Cholecalciferol (VITAMIN D PO) Take by mouth.    [provider]  FIBER PO Take by mouth.    [provider]  Glucosamine HCl (GLUCOSAMINE PO) Take by mouth.    [provider]  lisinopril (PRINIVIL,ZESTRIL) 10 MG tablet Take 10 mg by mouth daily.    [provider]  Loratadine 10 MG CAPS     [provider]  metFORMIN  (GLUCOPHAGE -XR) 500 MG 24 hr tablet Take 1 tablet by mouth once daily with breakfast Patient not taking: Reported on 07/03/2022 01/07/22   Kassie Mallick, MD  Multiple Vitamin (MULTIVITAMIN) capsule Take 1 capsule by mouth daily.    [provider]  omeprazole (PRILOSEC) 10 MG capsule 20 mg daily.    [provider]  Probiotic Product (PROBIOTIC-10) CAPS      [provider]  rosuvastatin (CRESTOR) 5 MG tablet Take 5 mg by mouth daily. 06/21/22   [provider]    Family History Family History  Problem Relation Age of Onset   Diabetes Other     Social History Social History   Tobacco Use   Smoking status: Never   Smokeless tobacco: Never  Vaping Use   Vaping status: Never Used  Substance Use Topics   Alcohol use: Yes   Drug use: No     Allergies   Pravastatin   Review of Systems Review of Systems PER HPI  Physical Exam Triage Vital Signs ED Triage Vitals  Encounter Vitals Group     BP 10/10/24 1238 135/85     Girls Systolic BP Percentile --      Girls Diastolic BP Percentile --      Boys Systolic BP Percentile --      Boys Diastolic BP Percentile --      Pulse Rate 10/10/24 1238 76     Resp 10/10/24 1238 20     Temp 10/10/24 1238 98.4 F (36.9 C)     Temp Source 10/10/24 1238 Oral     SpO2 10/10/24 1238 94 %     Weight --      Height --      Head Circumference --      Peak Flow --  Pain Score 10/10/24 1241 8     Pain Loc --      Pain Education --      Exclude from Growth Chart --    No data found.  Updated Vital Signs BP 135/85 (BP Location: Right Arm)   Pulse 76   Temp 98.4 F (36.9 C) (Oral)   Resp 20   SpO2 94%   Visual Acuity Right Eye Distance:   Left Eye Distance:   Bilateral Distance:    Right Eye Near:   Left Eye Near:    Bilateral Near:     Physical Exam Vitals and nursing note reviewed.  Constitutional:      Appearance: Normal appearance.  HENT:     Head: Atraumatic.  Eyes:     Extraocular Movements: Extraocular movements intact.     Conjunctiva/sclera: Conjunctivae normal.  Cardiovascular:     Rate and Rhythm: Normal rate.  Pulmonary:     Effort: Pulmonary effort is normal.  Musculoskeletal:        General: Tenderness present. No swelling or deformity. Normal range of motion.     Cervical back: Normal range of motion and neck supple.  Skin:     General: Skin is warm.     Findings: Erythema present.     Comments: Scabbed lesion to the dorsal left midfoot in the area of the cat scratch, very trace surrounding erythema to the area.  No fluctuance, induration, drainage, bleeding  Neurological:     Mental Status: He is oriented to person, place, and time.     Comments: Left lower extremity neurovascularly intact  Psychiatric:        Mood and Affect: Mood normal.        Thought Content: Thought content normal.        Judgment: Judgment normal.      UC Treatments / Results  Labs (all labs ordered are listed, but only abnormal results are displayed) Labs Reviewed - No data to display  EKG   Radiology DG Foot Complete Left Result Date: 10/10/2024 EXAM: 3 OR MORE VIEW(S) XRAY OF THE LEFT FOOT 10/10/2024 01:13:46 PM COMPARISON: None available. CLINICAL HISTORY: Left dorsal midfoot pain x several days, no known injury. FINDINGS: BONES AND JOINTS: No acute fracture. No focal osseous lesion. No joint dislocation. SOFT TISSUES: The soft tissues are unremarkable. IMPRESSION: 1. No significant abnormality. Electronically signed by: Waddell Calk MD 10/10/2024 01:26 PM EST RP Workstation: [id number]    Procedures Procedures (including critical care time)  Medications Ordered in UC Medications - No data to display  Initial Impression / Assessment and Plan / UC Course  I have reviewed the triage vital signs and the nursing notes.  Pertinent labs & imaging results that were available during my care of the patient were reviewed by me and considered in my medical decision making (see chart for details).     X-ray of the left foot negative for acute bony abnormality.  Possibly lingering infection from cat scratch but overall well-appearing on exam.  Could also be a tendinitis/overuse type injury from overcompensation of the other foot which is currently fractured.  Discussed Augmentin to cover for infection from cat scratch, Epsom salt  soaks, elevation, ice, over-the-counter pain relievers.  Podiatry follow-up if not resolving.  Final Clinical Impressions(s) / UC Diagnoses   Final diagnoses:  Left foot pain  Cat scratch     Discharge Instructions      We will let you know if anything comes back abnormal on  the foot x-ray.  I have sent in a course of antibiotics just in case this is related to the recent cat scratch that you received to the area of pain.  Warm Epsom salt soaks, elevation, ice, over-the-counter pain relievers additionally.  Follow-up with foot specialist if not resolving.    ED Prescriptions     Medication Sig Dispense Auth. Provider   amoxicillin-clavulanate (AUGMENTIN) 875-125 MG tablet Take 1 tablet by mouth every 12 (twelve) hours. 14 tablet Stuart Vernell Norris, [place]      PDMP not reviewed this encounter.   Stuart Vernell Norris, PA-C 10/10/24 1358

## 2024-10-10 NOTE — ED Triage Notes (Signed)
 Pt reports left foot pain at the top since Friday. Recalls being scratched by a feral cat that he he had been feeding and around that area is where the pain is most prominent.

## 2024-10-13 ENCOUNTER — Ambulatory Visit: Payer: Self-pay | Admitting: Family Medicine

## 2024-10-29 ENCOUNTER — Encounter: Payer: Self-pay | Admitting: Nurse Practitioner

## 2024-10-29 ENCOUNTER — Ambulatory Visit: Attending: Nurse Practitioner | Admitting: Nurse Practitioner

## 2024-10-29 VITALS — BP 140/88 | HR 102 | Ht 72.0 in | Wt 264.0 lb

## 2024-10-29 DIAGNOSIS — E785 Hyperlipidemia, unspecified: Secondary | ICD-10-CM

## 2024-10-29 DIAGNOSIS — R000 Tachycardia, unspecified: Secondary | ICD-10-CM | POA: Diagnosis not present

## 2024-10-29 DIAGNOSIS — E66812 Obesity, class 2: Secondary | ICD-10-CM

## 2024-10-29 DIAGNOSIS — R7303 Prediabetes: Secondary | ICD-10-CM

## 2024-10-29 DIAGNOSIS — R931 Abnormal findings on diagnostic imaging of heart and coronary circulation: Secondary | ICD-10-CM | POA: Diagnosis not present

## 2024-10-29 DIAGNOSIS — I10 Essential (primary) hypertension: Secondary | ICD-10-CM | POA: Diagnosis not present

## 2024-10-29 DIAGNOSIS — R0609 Other forms of dyspnea: Secondary | ICD-10-CM

## 2024-10-29 DIAGNOSIS — Z6835 Body mass index (BMI) 35.0-35.9, adult: Secondary | ICD-10-CM

## 2024-10-29 MED ORDER — IVABRADINE HCL 5 MG PO TABS: 5.0000 mg | ORAL_TABLET | Freq: Once | ORAL | 0 refills | Status: AC

## 2024-10-29 MED ORDER — METOPROLOL TARTRATE 100 MG PO TABS: 100.0000 mg | ORAL_TABLET | Freq: Once | ORAL | 0 refills | Status: AC

## 2024-10-29 NOTE — Patient Instructions (Signed)
 Medication Instructions:  Your physician recommends that you continue on your current medications as directed. Please refer to the Current Medication list given to you today.  *If you need a refill on your cardiac medications before your next appointment, please call your pharmacy*  Lab Work: TODAY:  BMET  If you have labs (blood work) drawn today and your tests are completely normal, you will receive your results only by: MyChart Message (if you have MyChart) OR A paper copy in the mail If you have any lab test that is abnormal or we need to change your treatment, we will call you to review the results.  Testing/Procedures: Your physician has requested that you have an echocardiogram. Echocardiography is a painless test that uses sound waves to create images of your heart. It provides your doctor with information about the size and shape of your heart and how well your heart's chambers and valves are working. This procedure takes approximately one hour. There are no restrictions for this procedure. Please do NOT wear cologne, perfume, aftershave, or lotions (deodorant is allowed). Please arrive 15 minutes prior to your appointment time.  Please note: We ask at that you not bring children with you during ultrasound (echo/ vascular) testing. Due to room size and safety concerns, children are not allowed in the ultrasound rooms during exams. Our front office staff cannot provide observation of children in our lobby area while testing is being conducted. An adult accompanying a patient to their appointment will only be allowed in the ultrasound room at the discretion of the ultrasound technician under special circumstances. We apologize for any inconvenience.    Your physician has requested that you have cardiac CT. Cardiac computed tomography (CT) is a painless test that uses an x-ray machine to take clear, detailed pictures of your heart. For further information please visit [email or web address]  Please follow instruction sheet  BELOW:    Your cardiac CT will be scheduled at one of the below locations:   [facility] [address] [place], [place] [address] [phone] (Severe contrast allergies only)  OR   [facility] [address] [place], [place] [address] [phone]  OR   [facility] [address] [place], [place] [address] [phone]  OR   [facility] [address]  [place], [place] [address]  OR   [facility] [address] [place], [place] [phone]  If scheduled at [facility], please arrive at the [facility] and Children's Entrance (Entrance C2) of [facility] 30 minutes prior to test start time. You can use the FREE valet parking offered at entrance C (encouraged to control the heart rate for the test)  Proceed to the [facility] Radiology Department (first floor) to check-in and test prep.  All radiology patients and guests should use entrance C2 at [facility], accessed from [facility], even though the hospital's physical address listed is [address].  If scheduled at the Heart and Vascular Tower at [address], please enter the parking lot using the [address] entrance and use the FREE valet service at the patient drop-off area. Enter the building and check-in with registration on the main floor.  If scheduled at [facility], please arrive to the Heart and Vascular Center 15 mins early for check-in and test prep.  There is spacious parking and easy access to the radiology department from the [facility] Heart and  Vascular entrance. Please enter here and check-in with the desk attendant.   If scheduled at [facility], please arrive 30 minutes early for check-in and test prep.  Please follow these instructions carefully (unless otherwise directed):  An IV will be required  for this test and Nitroglycerin will be given.  Hold all erectile dysfunction medications at least 3 days (72 hrs) prior to test. (Ie viagra, cialis, sildenafil, tadalafil, etc)   On the Night Before the Test: Be sure to Drink plenty of water. Do not consume any caffeinated/decaffeinated beverages or chocolate 12 hours prior to your test. Do not take any antihistamines 12 hours prior to your test.   On the Day of the Test: Drink plenty of water until 1 hour prior to the test. Do not eat any food 1 hour prior to test. You may take your regular medications prior to the test.  Take metoprolol  (Lopressor ) 100 MG   & IVABRADINE  5 MG two hours prior to test. THIS HAS BEEN SENT TO [facility]       After the Test: Drink plenty of water. After receiving IV contrast, you may experience a mild flushed feeling. This is normal. On occasion, you may experience a mild rash up to 24 hours after the test. This is not dangerous. If this occurs, you can take Benadryl 25 mg, Zyrtec, Claritin, or Allegra and increase your fluid intake. (Patients taking Tikosyn should avoid Benadryl, and may take Zyrtec, Claritin, or Allegra) If you experience trouble breathing, this can be serious. If it is severe call 911 IMMEDIATELY. If it is mild, please call our office.  We will call to schedule your test 2-4 weeks out understanding that some insurance companies will need an authorization prior to the service being performed.   For more information and frequently asked questions, please visit our website : [email or web address]  For non-scheduling related questions, please contact the cardiac imaging nurse navigator should you have any questions/concerns: Cardiac Imaging Nurse Navigators Direct Office Dial: [phone]   For scheduling needs, including cancellations and rescheduling, please call Brittany, [phone].     Follow-Up: At [facility], you and your health needs are our priority.   As part of our continuing mission to provide you with exceptional heart care, our providers are all part of one team.  This team includes your primary Cardiologist (physician) and Advanced Practice Providers or APPs (Physician Assistants and Nurse Practitioners) who all work together to provide you with the care you need, when you need it.  Your next appointment:   8 week(s)  Provider:   Damien Braver, NP          We recommend signing up for the patient portal called MyChart.  Sign up information is provided on this After Visit Summary.  MyChart is used to connect with patients for Virtual Visits (Telemedicine).  Patients are able to view lab/test results, encounter notes, upcoming appointments, etc.  Non-urgent messages can be sent to your provider as well.   To learn more about what you can do with MyChart, go to [email or web address].   Other Instructions

## 2024-10-29 NOTE — Progress Notes (Signed)
 Office Visit    Patient Name: Mason Mcgee Date of Encounter: 10/29/2024  Primary Care Provider:  Hugh Charleston, MD (Inactive) Primary Cardiologist:  Darryle ONEIDA Decent, MD  Chief Complaint    53 year old male with a history of elevated coronary artery calcium score, hypertension, hyperlipidemia, prediabetes, hyperparathyroidism, GERD, and ED who presents for heart first clinic new patient evaluation.  Past Medical History    Past Medical History:  Diagnosis Date   Hyperparathyroidism    Hypertension    Past Surgical History:  Procedure Laterality Date   PARATHYROIDECTOMY      Allergies  Allergies  Allergen Reactions   Pravastatin Sodium Other (See Comments)   Pravastatin Other (See Comments)    Pt states this medication caused joint pain when taking it.      Labs/Other Studies Reviewed    The following studies were reviewed today:     Recent Labs: No results found for requested labs within last 365 days.  Recent Lipid Panel No results found for: CHOL, TRIG, HDL, CHOLHDL, VLDL, LDLCALC, LDLDIRECT  History of Present Illness    53 year old male with the above past medical history including elevated coronary artery calcium score, hypertension, hyperlipidemia, prediabetes, hyperparathyroidism, GERD, and ED.  He was referred to cardiology per PCP in the setting of elevated coronary calcium score (coronary calcium score was 1575, 90th percentile) in 09/2024.  He presents today for heart first clinic new patient evaluation. He reports a greater than 77-month history of shortness of breath with activity. He first noticed symptoms while lifting wood.  He felt sudden onset shortness of breath with associated diaphoresis.  Since this time he has noted progressive dyspnea on exertion, particularly when climbing stairs.  He reports rare palpitations that last seconds and resolve spontaneously, no associated symptoms.  He denies chest pain. He drinks 2 cups of  coffee in the morning, 2 glasses of tea in the afternoon. He drinks a shot of bourbon most nights.  He does not smoke.  He is retired, widowed.  His wife died approximately 4 years ago.  He does not have any children.  He does not have children.  Since his wife died he has become increasingly sedentary, this has been associated with weight gain.  He denies any significant family history of heart disease.   Home Medications    Current Outpatient Medications  Medication Sig Dispense Refill   Cholecalciferol (VITAMIN D PO) Take by mouth.     FIBER PO Take by mouth.     Glucosamine HCl (GLUCOSAMINE PO) Take by mouth.     ivabradine  (CORLANOR) 5 MG TABS tablet Take 1 tablet (5 mg total) by mouth once for 1 dose. Take 90-120 minutes prior to scan. 1 tablet 0   lisinopril (PRINIVIL,ZESTRIL) 10 MG tablet Take 10 mg by mouth daily.     Loratadine 10 MG CAPS      metoprolol  tartrate (LOPRESSOR ) 100 MG tablet Take 1 tablet (100 mg total) by mouth once for 1 dose. Take 90-120 minutes prior to scan. Hold for SBP less than 110. 1 tablet 0   Multiple Vitamin (MULTIVITAMIN) capsule Take 1 capsule by mouth daily.     Probiotic Product (PROBIOTIC-10) CAPS      rosuvastatin (CRESTOR) 5 MG tablet Take 5 mg by mouth daily.     zinc gluconate 50 MG tablet Take 50 mg by mouth daily.     No current facility-administered medications for this visit.     Review of Systems  He denies chest pain, palpitations, dyspnea, pnd, orthopnea, n, v, dizziness, syncope, edema, weight gain, or early satiety. All other systems reviewed and are otherwise negative except as noted above.   Physical Exam    VS:  BP (!) 140/88   Pulse (!) 102   Ht 6' (1.829 m)   Wt 264 lb (119.7 kg)   SpO2 97%   BMI 35.80 kg/m  GEN: Well nourished, well developed, in no acute distress. HEENT: normal. Neck: Supple, no JVD, carotid bruits, or masses. Cardiac: RRR, no murmurs, rubs, or gallops. No clubbing, cyanosis, edema.  Radials/DP/PT 2+  and equal bilaterally.  Respiratory:  Respirations regular and unlabored, clear to auscultation bilaterally. GI: Soft, nontender, nondistended, BS + x 4. MS: no deformity or atrophy. Skin: warm and dry, no rash. Neuro:  Strength and sensation are intact. Psych: Normal affect.  Accessory Clinical Findings    ECG personally reviewed by me today - EKG Interpretation Date/Time:  Friday October 29 2024 13:48:56 EST Ventricular Rate:  102 PR Interval:  158 QRS Duration:  82 QT Interval:  342 QTC Calculation: 445 R Axis:   47  Text Interpretation: Sinus tachycardia When compared with ECG of 20-Jan-2009 06:14, ST no longer elevated in Anterior leads Confirmed by Daneen Perkins ([id number]) on 10/29/2024 1:57:56 PM  - no acute changes.   No results found for: WBC, HGB, HCT, MCV, PLT Lab Results  Component Value Date   CREATININE 1.03 12/12/2020   BUN 8 12/12/2020   NA 136 12/12/2020   K 3.9 12/12/2020   CL 101 12/12/2020   CO2 27 12/12/2020   No results found for: ALT, AST, GGT, ALKPHOS, BILITOT No results found for: CHOL, HDL, LDLCALC, LDLDIRECT, TRIG, CHOLHDL  Lab Results  Component Value Date   HGBA1C 5.9 (A) 07/03/2022    Assessment & Plan   1. Dyspnea on exertion/elevated coronary artery calcium score/sinus tachycardia:  Coronary calcium score was 1575, 90th percentile) in 09/2024 by [facility] body scan. He reports a greater than 36-month history of progressive shortness of breath with activity.  He denies chest pain.  He is mostly sedentary.  Through shared decision making, will pursue coronary CT angiogram. Will give ivabradine  5 mg and metoprolol  100 mg prior to scan given mildly elevated heart rate.  Will update BMET today.  Will check echocardiogram in the setting of exertional dyspnea.  Will refer to lipid clinic Pharm.D. as below.  Pending coronary CT results, consider initiation of low-dose aspirin. Reviewed ED precautions.  Continue Crestor.  2.  Hypertension: BP mildly elevated in office today.  Continue to monitor BP and report BP consistent greater than 130/80 mmHg.  For now, continue current antihypertensive regimen.   3. Hyperlipidemia: LDL was 100 in 07/2024.  He has a history of statin intolerance.  He is currently taking Crestor 5 mg daily, this has been his maximally tolerated dose.  He previously failed pravastatin.  Will go ahead and refer to lipid clinic Pharm.D. for consideration of PCSK9 inhibitor.  Continue Crestor.  4. Prediabetes/obesity: A1c was 6.2 in 07/2024. He does note some weight gain, generalized fatigue.  Will check TSH per patient request.  Encouraged ongoing lifestyle modifications with diet and exercise. Monitored and managed per PCP.  5. Disposition: Follow-up in 6 to 8 weeks with Dr. Barbaraann or APP.  Perkins JAYSON Daneen, NP 10/29/2024, 4:35 PM

## 2024-10-30 LAB — BASIC METABOLIC PANEL WITH GFR
BUN/Creatinine Ratio: 12 (ref 9–20)
BUN: 12 mg/dL (ref 6–24)
CO2: 24 mmol/L (ref 20–29)
Calcium: 9.6 mg/dL (ref 8.7–10.2)
Chloride: 103 mmol/L (ref 96–106)
Creatinine, Ser: 1 mg/dL (ref 0.76–1.27)
Glucose: 87 mg/dL (ref 70–99)
Potassium: 4.5 mmol/L (ref 3.5–5.2)
Sodium: 142 mmol/L (ref 134–144)
eGFR: 92 mL/min/1.73 (ref >59)

## 2024-10-30 LAB — TSH: TSH: 1.14 u[IU]/mL (ref 0.450–4.500)

## 2024-11-03 ENCOUNTER — Ambulatory Visit: Payer: Self-pay | Admitting: Nurse Practitioner

## 2024-11-15 ENCOUNTER — Encounter (HOSPITAL_COMMUNITY): Payer: Self-pay

## 2024-11-19 ENCOUNTER — Ambulatory Visit (HOSPITAL_BASED_OUTPATIENT_CLINIC_OR_DEPARTMENT_OTHER)
Admission: RE | Admit: 2024-11-19 | Discharge: 2024-11-19 | Disposition: A | Discharge status: Home / Self Care | Source: Ambulatory Visit | Attending: Cardiology | Admitting: Cardiology

## 2024-11-19 ENCOUNTER — Ambulatory Visit (HOSPITAL_COMMUNITY)
Admission: RE | Admit: 2024-11-19 | Discharge: 2024-11-19 | Disposition: A | Discharge status: Home / Self Care | Source: Ambulatory Visit | Attending: Nurse Practitioner | Admitting: Nurse Practitioner

## 2024-11-19 ENCOUNTER — Other Ambulatory Visit: Payer: Self-pay | Admitting: Cardiology

## 2024-11-19 DIAGNOSIS — R931 Abnormal findings on diagnostic imaging of heart and coronary circulation: Secondary | ICD-10-CM

## 2024-11-19 DIAGNOSIS — R0609 Other forms of dyspnea: Secondary | ICD-10-CM | POA: Insufficient documentation

## 2024-11-19 MED ORDER — DILTIAZEM HCL 25 MG/5ML IV SOLN
10.0000 mg | INTRAVENOUS | Status: DC | PRN
  Administered 2024-11-19: 5 mg via INTRAVENOUS

## 2024-11-19 MED ORDER — METOPROLOL TARTRATE 5 MG/5ML IV SOLN
10.0000 mg | Freq: Once | INTRAVENOUS | Status: AC | PRN
  Administered 2024-11-19: 10 mg via INTRAVENOUS

## 2024-11-19 MED ORDER — IOHEXOL 350 MG/ML SOLN
100.0000 mL | Freq: Once | INTRAVENOUS | Status: AC | PRN
  Administered 2024-11-19: 100 mL via INTRAVENOUS

## 2024-11-19 MED ORDER — NITROGLYCERIN 0.4 MG SL SUBL
0.8000 mg | SUBLINGUAL_TABLET | Freq: Once | SUBLINGUAL | Status: AC
  Administered 2024-11-19: 0.8 mg via SUBLINGUAL

## 2024-11-24 ENCOUNTER — Ambulatory Visit: Admitting: Pharmacist Clinician (PhC)/ Clinical Pharmacy Specialist

## 2024-11-24 ENCOUNTER — Encounter: Payer: Self-pay | Admitting: Pharmacist Clinician (PhC)/ Clinical Pharmacy Specialist

## 2024-11-24 DIAGNOSIS — E785 Hyperlipidemia, unspecified: Secondary | ICD-10-CM | POA: Insufficient documentation

## 2024-11-24 NOTE — Patient Instructions (Signed)
 Your Results:             Your most recent labs Goal  Total Cholesterol 193 < 200  Triglycerides 333 < 150  HDL (happy/good cholesterol) 37 > 40  LDL (lousy/bad cholesterol 100 < 70   Medication changes:  We will start the process to get Repatha covered by your insurance.  Once the prior authorization is complete, I will send a MyChart message to let you know and confirm pharmacy information.   You will take one injection every 14 days  Lab orders:  We want to repeat labs after 2-3 months.  We will send you a lab order to remind you once we get closer to that time.    Repatha Co-pay Card Instructions 1.      Go to [email or web address]  2.      Click the red Repahta Co-Pay Card button near the top right 3.      Scroll down and click Yes, I have a Repatha prescription 4.      Continue to scroll down and selected [employer]  5.      Continue to scroll down and for the question Are you eligible for Medicare but receiving prescription drug coverage from a former employer, union, or welfare plan? select No 6.      Continue to scroll down and click the first box which is next to Repatha Co-Pay Card, and beneath that, select I want to enroll in the Repatha Co-Pay Card Program 7.      Continue to scroll down until you see the Next button, then click it 8.      Fill out your personal information then click the Next button    Thank you for choosing [facility]

## 2024-11-24 NOTE — Assessment & Plan Note (Signed)
 Assessment: Patient with CAD not at LDL goal of < 70 Most recent LDL 100 on 07/30/24 Has been compliant with moderate intensity statin : rosuvastatin 5 mg daily Not able to tolerate statins secondary to myalgias: pravastatin Reviewed options for lowering LDL cholesterol, specifically PCSK-9 inhibitors.  Discussed mechanisms of action, dosing, side effects, potential decreases in LDL cholesterol and costs.  Also reviewed potential options for patient assistance.  Plan: Patient agreeable to starting Repatha 140 mg q14d Patient will send copy of new insurance card once received. Repeat labs after:  2 months Lipid Liver function Patient was given information on [employer] - will sign patient up when PA approved Marital status Income < $72,000 (single) or < $102,000 (married)

## 2024-11-24 NOTE — Progress Notes (Signed)
 "  Office Visit    Patient Name: Mason Mcgee Date of Encounter: 11/24/2024  Primary Care Provider:  Hugh Charleston, MD (Inactive) Primary Cardiologist:  Darryle ONEIDA Decent, MD  Chief Complaint    Hyperlipidemia   Significant Past Medical History   CAD 11/25 CAC = 1213 (99th percentile)  HTN Elevated at last visit, continue to monitor, on lisinopril  preDM 9/25 A1c 6.2, followed by PCP  obesity BMI 35.8        Allergies[1]  History of Present Illness    Mason Mcgee is a 52 y.o. male patient of Dr Decent, in the office today to discuss options for cholesterol management.  Insurance Carrier: [employer]) changes to [employer]   LDL Cholesterol goal:  LDL < 70  Current Medications:   rosuvastatin 5 mg daily  Previously tried: pravastatin - myalgias   Family Hx:   father had stroke in 80's died of complication; mother died aneurysm in her 64's; 3 siblings, no issues , no kids  Social Hx: Tobacco: no  Alcohol: 1 shot per day     Diet:   mostly home unless travelling; ; cooks from scratch , avoids packaged meals ; vegetables mostly canned corn and green beans, potatoes; not snacking much;  3 cups coffee ([employer] brand) daily   Exercise: no; will try better; previously did cycling - would like to get back to that    Accessory Clinical Findings   In [facility]:   07/30/2024:  TC 193, TG 333, HDL 37, LDL 100   AST 29, ALT 39 Lab Results  Component Value Date   CREATININE 1.00 10/29/2024   BUN 12 10/29/2024   NA 142 10/29/2024   K 4.5 10/29/2024   CL 103 10/29/2024   CO2 24 10/29/2024   Lab Results  Component Value Date   HGBA1C 5.9 (A) 07/03/2022    Home Medications    Current Outpatient Medications  Medication Sig Dispense Refill   Cholecalciferol (VITAMIN D PO) Take by mouth.     FIBER PO Take by mouth.     Glucosamine HCl (GLUCOSAMINE PO) Take by mouth.     lisinopril (PRINIVIL,ZESTRIL) 10 MG tablet Take 10 mg by mouth daily.     Loratadine 10 MG  CAPS      metoprolol  tartrate (LOPRESSOR ) 100 MG tablet Take 1 tablet (100 mg total) by mouth once for 1 dose. Take 90-120 minutes prior to scan. Hold for SBP less than 110. 1 tablet 0   Multiple Vitamin (MULTIVITAMIN) capsule Take 1 capsule by mouth daily.     Probiotic Product (PROBIOTIC-10) CAPS      rosuvastatin (CRESTOR) 5 MG tablet Take 5 mg by mouth daily.     zinc gluconate 50 MG tablet Take 50 mg by mouth daily.     No current facility-administered medications for this visit.     Assessment & Plan    Hyperlipidemia LDL goal <70 Assessment: Patient with CAD not at LDL goal of < 70 Most recent LDL 100 on 07/30/24 Has been compliant with moderate intensity statin : rosuvastatin 5 mg daily Not able to tolerate statins secondary to myalgias: pravastatin Reviewed options for lowering LDL cholesterol, specifically PCSK-9 inhibitors.  Discussed mechanisms of action, dosing, side effects, potential decreases in LDL cholesterol and costs.  Also reviewed potential options for patient assistance.  Plan: Patient agreeable to starting Repatha 140 mg q14d Patient will send copy of new insurance card once received. Repeat labs after:  2 months Lipid  Liver function Patient was given information on [employer] - will sign patient up when PA approved Marital status Income < $72,000 (single) or < $102,000 (married)   Allean Mink, PharmD CPP [facility] [address]   [place], [place] [address] [phone]  11/24/2024, 9:16 AM       [1]  Allergies Allergen Reactions   Pravastatin Sodium Other (See Comments)   Pravastatin Other (See Comments)    Pt states this medication caused joint pain when taking it.    "

## 2024-11-27 ENCOUNTER — Encounter: Payer: Self-pay | Admitting: Pharmacist Clinician (PhC)/ Clinical Pharmacy Specialist

## 2024-11-29 ENCOUNTER — Telehealth: Payer: Self-pay | Admitting: Pharmacist Clinician (PhC)/ Clinical Pharmacy Specialist

## 2024-11-29 ENCOUNTER — Telehealth: Payer: Self-pay | Admitting: Pharmacy Technician

## 2024-11-29 ENCOUNTER — Other Ambulatory Visit (HOSPITAL_COMMUNITY): Payer: Self-pay

## 2024-11-29 NOTE — Telephone Encounter (Signed)
 Please to PA for Repatha.  New insurance card picture sent via MyChart HERBALIST)

## 2024-11-29 NOTE — Telephone Encounter (Signed)
" ° ° °  Pharmacy Patient Advocate Encounter   Received notification from Kristin that prior authorization for repatha is required/requested.   Insurance verification completed.   The patient is insured through [facility].   Per test claim: PA required; PA submitted to above mentioned insurance via [employer]/confirmation #/EOC [id number] Status is pending   "

## 2024-11-30 NOTE — Telephone Encounter (Signed)
 Faxed more information as requested

## 2024-12-01 ENCOUNTER — Encounter (HOSPITAL_COMMUNITY): Payer: Self-pay

## 2024-12-01 ENCOUNTER — Ambulatory Visit (HOSPITAL_COMMUNITY): Admission: RE | Admit: 2024-12-01 | Source: Ambulatory Visit

## 2024-12-01 ENCOUNTER — Other Ambulatory Visit (HOSPITAL_COMMUNITY): Payer: Self-pay

## 2024-12-01 NOTE — Telephone Encounter (Signed)
 Pharmacy Patient Advocate Encounter  Received notification from [facility] that Prior Authorization for repatha has been APPROVED from 11/29/24 to 11/29/27. Ran test claim, Copay is $34.99. This test claim was processed through [facility]- copay amounts may vary at other pharmacies due to pharmacy/plan contracts, or as the patient moves through the different stages of their insurance plan.

## 2024-12-01 NOTE — Telephone Encounter (Signed)
 Faxed more info

## 2024-12-02 ENCOUNTER — Encounter: Payer: Self-pay | Admitting: Pharmacist Clinician (PhC)/ Clinical Pharmacy Specialist

## 2024-12-02 DIAGNOSIS — E785 Hyperlipidemia, unspecified: Secondary | ICD-10-CM

## 2024-12-02 MED ORDER — REPATHA SURECLICK 140 MG/ML ~~LOC~~ SOAJ: 140.0000 mg | SUBCUTANEOUS | 3 refills | Status: AC

## 2024-12-02 NOTE — Addendum Note (Signed)
 Addended by: Fareed Fung L on: 12/02/2024 01:21 PM   Modules accepted: Orders

## 2024-12-14 ENCOUNTER — Ambulatory Visit

## 2024-12-14 ENCOUNTER — Ambulatory Visit: Admitting: Podiatry

## 2024-12-14 DIAGNOSIS — E559 Vitamin D deficiency, unspecified: Secondary | ICD-10-CM

## 2024-12-14 DIAGNOSIS — G5762 Lesion of plantar nerve, left lower limb: Secondary | ICD-10-CM

## 2024-12-14 DIAGNOSIS — M84375A Stress fracture, left foot, initial encounter for fracture: Secondary | ICD-10-CM | POA: Diagnosis not present

## 2024-12-14 MED ORDER — MELOXICAM 15 MG PO TABS: 15.0000 mg | ORAL_TABLET | Freq: Every day | ORAL | 0 refills | Status: AC

## 2024-12-14 NOTE — Progress Notes (Signed)
 "  Subjective:  Patient ID: Mason Mcgee, male    DOB: May 24, 1971,  MRN: [id number]  Chief Complaint  Patient presents with   Foot Pain    Patient presents today c/o Left foot pain reports it started around November 2025 denies any injury to area , reports swelling and heat. Patient was prescribed diclofenac 50mg  with little to no change in symptoms denies any epsom salt soaks .    Discussed the use of AI scribe software for clinical note transcription with the patient, who gave verbal consent to proceed.  History of Present Illness Mason Mcgee is a 55 year old male with osteopenia and prior right foot fracture who presents with progressive left foot pain and swelling.  Symptoms began acutely one morning with limping and no injury. Pain worsened by the next day, prompting urgent care evaluation. X-rays were obtained and he was treated empirically with antibiotics for suspected infection from a cat scratch, without improvement after one week.  His PCP then evaluated him. Labs for gout and infection were normal. Pain and swelling remained significant. Symptoms started on the dorsal foot and later involved the plantar surface. He has morning limping, pain worsened by activity, and severe pain after removing shoes, sometimes preventing ambulation. Swelling is persistent. He denies numbness, tingling, or burning. NSAIDs provide partial, temporary relief.  He had a right foot fracture in late last year treated with a walking boot. He has osteopenia attributed to hyperparathyroidism diagnosed in 2012 and takes 6000 IU vitamin D  daily. Recent labs showed normal calcium, renal, and thyroid  function.      Objective:  There were no vitals filed for this visit.  Physical Exam General: AAO x3, NAD  Dermatological: Skin is warm, dry and supple bilateral. There are no open sores, no preulcerative lesions, no rash or signs of infection present.  Vascular: Dorsalis Pedis artery and Posterior Tibial  artery pedal pulses are 2/4 bilateral with immedate capillary fill time. There is no pain with calf compression, swelling, warmth, erythema.   Neruologic: Grossly intact via light touch bilateral.   Musculoskeletal: There is pinpoint tenderness noted directly on the left third, fourth metatarsals there is localized edema present dorsal aspect of foot but there is no erythema or warmth.  Flexor, extensor tendons appear to be intact.     No images are attached to the encounter.    Results   Radiology Left foot X-ray: On the third metatarsal along the oblique view concern for possible radiolucency.  Concern about stress fracture.  There is no obvious fracture line noted otherwise.   Assessment:   1. Stress fracture, left foot, initial encounter for fracture   2. Vitamin D  deficiency      Plan:  Patient was evaluated and treated and all questions answered.  Assessment and Plan Assessment & Plan Stress fracture of the left foot Subacute stress fracture of the left third metatarsal due to altered gait mechanics and increased load. Radiographs show haziness; high clinical suspicion remains. Gout and infection unlikely. High arch and heel pitch may contribute to stress versus compensation  given right foot injury. Corticosteroids deferred due to delayed bone healing risk. Anticipated improvement with immobilization and conservative management. - Recommended immobilization with a walking boot.  He has a boot at home that he is can to wear. - Advised local cryotherapy. - Prescribed meloxicam  once daily; discontinue previous anti-inflammatories. - Deferred corticosteroid therapy. - Scheduled follow-up in three weeks with repeat radiograph. - Discussed possible CT scan if  improvement is insufficient.  Vitamin D  deficiency Osteopenia and prior parathyroid disorder with ongoing high-dose vitamin D  supplementation. Recent labs show normal calcium and renal function. Vitamin D  level not  recently assessed; essential for bone healing. - Ordered serum vitamin D  level. - Provided instructions for [facility] walk-in phlebotomy.    Return in about 3 weeks (around 01/04/2025) for stress fracture left foot, x-ray.   Donnice JONELLE Fees DPM  "

## 2024-12-15 ENCOUNTER — Ambulatory Visit: Payer: Self-pay | Admitting: Podiatry

## 2024-12-15 LAB — VITAMIN D 25 HYDROXY (VIT D DEFICIENCY, FRACTURES): Vit D, 25-Hydroxy: 46.3 ng/mL (ref 30.0–100.0)

## 2024-12-24 ENCOUNTER — Ambulatory Visit: Payer: Self-pay | Attending: Nurse Practitioner | Admitting: Nurse Practitioner

## 2024-12-24 ENCOUNTER — Encounter: Payer: Self-pay | Admitting: Nurse Practitioner

## 2024-12-24 VITALS — BP 128/80 | HR 100 | Ht 72.0 in | Wt 261.0 lb

## 2024-12-24 DIAGNOSIS — I2510 Atherosclerotic heart disease of native coronary artery without angina pectoris: Secondary | ICD-10-CM | POA: Diagnosis not present

## 2024-12-24 DIAGNOSIS — R0609 Other forms of dyspnea: Secondary | ICD-10-CM | POA: Diagnosis not present

## 2024-12-24 DIAGNOSIS — I10 Essential (primary) hypertension: Secondary | ICD-10-CM

## 2024-12-24 DIAGNOSIS — Z6835 Body mass index (BMI) 35.0-35.9, adult: Secondary | ICD-10-CM

## 2024-12-24 DIAGNOSIS — R7303 Prediabetes: Secondary | ICD-10-CM | POA: Diagnosis not present

## 2024-12-24 DIAGNOSIS — E66812 Obesity, class 2: Secondary | ICD-10-CM

## 2024-12-24 DIAGNOSIS — E785 Hyperlipidemia, unspecified: Secondary | ICD-10-CM

## 2024-12-24 NOTE — Patient Instructions (Signed)
 Medication Instructions:  Your physician recommends that you continue on your current medications as directed. Please refer to the Current Medication list given to you today.  *If you need a refill on your cardiac medications before your next appointment, please call your pharmacy*  Lab Work: NONE ordered at this time of appointment   Testing/Procedures: Your physician has requested that you have an echocardiogram. Echocardiography is a painless test that uses sound waves to create images of your heart. It provides your doctor with information about the size and shape of your heart and how well your hearts chambers and valves are working. This procedure takes approximately one hour. There are no restrictions for this procedure. Please do NOT wear cologne, perfume, aftershave, or lotions (deodorant is allowed). Please arrive 15 minutes prior to your appointment time.  Please note: We ask at that you not bring children with you during ultrasound (echo/ vascular) testing. Due to room size and safety concerns, children are not allowed in the ultrasound rooms during exams. Our front office staff cannot provide observation of children in our lobby area while testing is being conducted. An adult accompanying a patient to their appointment will only be allowed in the ultrasound room at the discretion of the ultrasound technician under special circumstances. We apologize for any inconvenience.    Follow-Up: At [facility], you and your health needs are our priority.  As part of our continuing mission to provide you with exceptional heart care, our providers are all part of one team.  This team includes your primary Cardiologist (physician) and Advanced Practice Providers or APPs (Physician Assistants and Nurse Practitioners) who all work together to provide you with the care you need, when you need it.  Your next appointment:   6 month(s)  Provider:   Damien Braver, NP          We recommend  signing up for the patient portal called MyChart.  Sign up information is provided on this After Visit Summary.  MyChart is used to connect with patients for Virtual Visits (Telemedicine).  Patients are able to view lab/test results, encounter notes, upcoming appointments, etc.  Non-urgent messages can be sent to your provider as well.   To learn more about what you can do with MyChart, go to [email or web address].

## 2024-12-24 NOTE — Progress Notes (Unsigned)
 "  Office Visit    Patient Name: Mason Mcgee Date of Encounter: 12/24/2024  Primary Care Provider:  Auston Opal, DO Primary Cardiologist:  Darryle ONEIDA Decent, MD  Chief Complaint    54 year old male with a history of nonobstructive CAD, hypertension, hyperlipidemia, prediabetes, hyperparathyroidism, GERD, and ED who presents for follow-up related to CAD.  Past Medical History    Past Medical History:  Diagnosis Date   Hyperparathyroidism    Hypertension    Past Surgical History:  Procedure Laterality Date   PARATHYROIDECTOMY      Allergies  Allergies[1]   Labs/Other Studies Reviewed    The following studies were reviewed today:  Cardiac Studies & Procedures   ______________________________________________________________________________________________          CT SCANS  CT CORONARY MORPH W/CTA COR W/SCORE 11/19/2024  Addendum 11/20/2024  4:52 PM ADDENDUM REPORT: 11/20/2024 16:50  EXAM: OVER-READ INTERPRETATION  CT CHEST  The following report is an over-read performed by radiologist Dr. Andrea Gasman of [facility] on 11/20/2024. This over-read does not include interpretation of cardiac or coronary anatomy or pathology. The coronary CTA interpretation by the cardiologist is attached.  COMPARISON:  None.  FINDINGS: Vascular: No aortic atherosclerosis. The included aorta is normal in caliber.  Mediastinum/nodes: No adenopathy or mass. Unremarkable esophagus.  Lungs: No focal airspace disease. No pulmonary nodule. No pleural fluid. The included airways are patent.  Upper abdomen: No acute or unexpected findings.  Musculoskeletal: There are no acute or suspicious osseous abnormalities. Thoracic spondylosis.  IMPRESSION: No significant extracardiac findings.   Electronically Signed By: Andrea Gasman M.D. On: 11/20/2024 16:50  Narrative CLINICAL DATA:  54 yo male with DOE  EXAM: Cardiac/Coronary CTA  TECHNIQUE: A  non-contrast, gated CT scan was obtained with axial slices of 2.5 mm through the heart for calcium scoring. Calcium scoring was performed using the Agatston method. A 120 kV prospective, gated, contrast cardiac CT scan was obtained. Gantry rotation speed was 230 msec and collimation was 0.63 mm. Two sublingual nitroglycerin  tablets (0.8 mg) were given. The 3D data set was reconstructed with motion correction for the best systolic or diastolic phase. Images were analyzed on a dedicated workstation using MPR, MIP, and VRT modes. The patient received 95 cc of contrast.  FINDINGS: Image quality: Average.  Noise artifact is: Limited.  Coronary Arteries:  Normal coronary origin.  Right dominance.  Left main: The left main is a large caliber vessel with a normal take off from the left coronary cusp that bifurcates to form a left anterior descending artery and a left circumflex artery. There is minimal (0-24) plaque.  Left anterior descending artery: The LAD has mild (25-49) plaque in the proximal and distal vessel and minimal (0-24) plaque in the mid vessel. The LAD gives off 2 diagonal branches with minimal (0-24) plaque in the ostia.  Left circumflex artery: The LCX is non-dominant and has minimal (0-24) plaque in the proximal, mid and distal vessel. The LCX gives off 3 obtuse marginal branches. There is moderate (50-69) plaque in proximal OM1.  Right coronary artery: The RCA is dominant with normal take off from the right coronary cusp. There is mild (25-49) plaque in the proximal and distal vessel. The RCA terminates as a PDA without evidence of plaque or stenosis.  Right Atrium: Right atrial size is within normal limits.  Right Ventricle: The right ventricular cavity is within normal limits.  Left Atrium: Left atrial size is normal in size with no left atrial appendage  filling defect.  Left Ventricle: The ventricular cavity size is within normal limits. LVH  noted.  Pulmonary arteries: Dilated (3.7 cm)  Pulmonary veins: Normal pulmonary venous drainage.  Pericardium: Normal thickness without significant effusion or calcium present.  Cardiac valves: The aortic valve is trileaflet without significant calcification. The mitral valve is normal without significant calcification.  Aorta: Normal caliber without significant disease.  Extra-cardiac findings: See attached radiology report for non-cardiac structures.  IMPRESSION: 1. Coronary calcium score of 1213. This was 17 percentile for age-, sex, and race-matched controls.  2. Dilated pulmonary artery (3.7 cm) suggestive of pulmonary hypertension.  3. Normal coronary origin with right dominance.  4. Moderate (50-69) plaque in OM1; mild (25-49) plaque in proximal/distal LAD and RCA; otherwise minimal plaque as outlined.  RECOMMENDATIONS: Moderate stenosis. Consider symptom-guided anti-ischemic pharmacotherapy as well as risk factor modification per guideline directed care. Additional analysis with CT FFR will be submitted.  Redell Shallow, MD  Electronically Signed: By: Redell Shallow M.D. On: 11/19/2024 14:34     ______________________________________________________________________________________________     Recent Labs: 10/29/2024: BUN 12; Creatinine, Ser 1.00; Potassium 4.5; Sodium 142; TSH 1.140  Recent Lipid Panel No results found for: CHOL, TRIG, HDL, CHOLHDL, VLDL, LDLCALC, LDLDIRECT  History of Present Illness    54 year old male with the above past medical history including nonobstructive CAD, hypertension, hyperlipidemia, prediabetes, hyperparathyroidism, GERD, and ED.   He was referred to cardiology per PCP in the setting of elevated coronary calcium score (coronary calcium score was 1575, 90th percentile) in 09/2024. He was last seen in the clinic on 10/29/2024 for heart first clinic new patient evaluation and reported a greater than 29-month  history of shortness of breath with activity.  Coronary CT angiogram in 10/2024 revealed coronary calcium score 1213 (90th percentile), moderate plaque in the OM1, mild plaque in the proximal/distal LAD and RCA, FFR abnormal and very distal LAD, otherwise negative.  Echocardiogram was ordered but not completed due to financial constraints.  He was referred to lipid clinic Pharm.D. and was started on Repatha .  He presents today for follow-up.  Since his last visit He has been stable from a cardiac standpoint.  He does note some improvement in his shortness of breath with exercise.  He denies any chest pain or other symptoms concerning for angina.  Overall, he reports feeling well.  His insurance did not previously cover echocardiogram, will reattempt to schedule echocardiogram.  If cost prohibitive, we will cancel order.  Pending repeat fasting lipids in 3 months.  Monitor for any progressive symptoms.  Follow-up in 6 months, sooner if needed.  Encouraged increase activity as tolerated.  1.  CAD: Coronary calcium score was 1575 (90th percentile) in 09/2024 by [facility] body scan. He reports a greater than 24-month history of progressive shortness of breath with activity.  He denies chest pain.  He is mostly sedentary.  Through shared decision making, will pursue coronary CT angiogram. Will give ivabradine  5 mg and metoprolol  100 mg prior to scan given mildly elevated heart rate.  Will update BMET today.  Will check echocardiogram in the setting of exertional dyspnea.  Will refer to lipid clinic Pharm.D. as below.  Pending coronary CT results, consider initiation of low-dose aspirin. Reviewed ED precautions.  Continue Crestor.   2. Hypertension: BP mildly elevated in office today.  Continue to monitor BP and report BP consistent greater than 130/80 mmHg.  For now, continue current antihypertensive regimen.    3. Hyperlipidemia: LDL was 100 in 07/2024.  He  has a history of statin intolerance.  He is currently taking  Crestor 5 mg daily, this has been his maximally tolerated dose.  He previously failed pravastatin.  Will go ahead and refer to lipid clinic Pharm.D. for consideration of PCSK9 inhibitor.  Continue Crestor.   4. Prediabetes/obesity: A1c was 6.2 in 07/2024. He does note some weight gain, generalized fatigue.  Will check TSH per patient request.  Encouraged ongoing lifestyle modifications with diet and exercise. Monitored and managed per PCP.   5. Disposition: Follow-up in  Home Medications    Current Outpatient Medications  Medication Sig Dispense Refill   Cholecalciferol (VITAMIN D  PO) Take by mouth.     diclofenac (VOLTAREN) 50 MG EC tablet Take 50 mg by mouth daily.     Evolocumab  (REPATHA  SURECLICK) 140 MG/ML SOAJ Inject 140 mg into the skin every 14 (fourteen) days. 6 mL 3   FIBER PO Take by mouth.     Glucosamine HCl (GLUCOSAMINE PO) Take by mouth.     lisinopril (PRINIVIL,ZESTRIL) 10 MG tablet Take 10 mg by mouth daily.     Loratadine 10 MG CAPS      meloxicam  (MOBIC ) 15 MG tablet Take 1 tablet (15 mg total) by mouth daily. 30 tablet 0   Multiple Vitamin (MULTIVITAMIN) capsule Take 1 capsule by mouth daily.     Probiotic Product (PROBIOTIC-10) CAPS      rosuvastatin (CRESTOR) 5 MG tablet Take 5 mg by mouth daily.     zinc gluconate 50 MG tablet Take 50 mg by mouth daily.     metoprolol  tartrate (LOPRESSOR ) 100 MG tablet Take 1 tablet (100 mg total) by mouth once for 1 dose. Take 90-120 minutes prior to scan. Hold for SBP less than 110. (Patient not taking: Reported on 12/24/2024) 1 tablet 0   No current facility-administered medications for this visit.     Review of Systems    ***.  All other systems reviewed and are otherwise negative except as noted above.    Physical Exam    VS:  BP 128/80 (Cuff Size: Large)   Pulse 100   Ht 6' (1.829 m)   Wt 261 lb (118.4 kg)   SpO2 98%   BMI 35.40 kg/m   GEN: Well nourished, well developed, in no acute distress. HEENT: normal. Neck:  Supple, no JVD, carotid bruits, or masses. Cardiac: RRR, no murmurs, rubs, or gallops. No clubbing, cyanosis, edema.  Radials/DP/PT 2+ and equal bilaterally.  Respiratory:  Respirations regular and unlabored, clear to auscultation bilaterally. GI: Soft, nontender, nondistended, BS + x 4. MS: no deformity or atrophy. Skin: warm and dry, no rash. Neuro:  Strength and sensation are intact. Psych: Normal affect.  Accessory Clinical Findings    ECG personally reviewed by me today -    - no acute changes.   No results found for: WBC, HGB, HCT, MCV, PLT Lab Results  Component Value Date   CREATININE 1.00 10/29/2024   BUN 12 10/29/2024   NA 142 10/29/2024   K 4.5 10/29/2024   CL 103 10/29/2024   CO2 24 10/29/2024   No results found for: ALT, AST, GGT, ALKPHOS, BILITOT No results found for: CHOL, HDL, LDLCALC, LDLDIRECT, TRIG, CHOLHDL  Lab Results  Component Value Date   HGBA1C 5.9 (A) 07/03/2022    Assessment & Plan    1.  ***      Damien JAYSON Braver, NP 12/24/2024, 3:01 PM       [1]  Allergies Allergen Reactions  Pravastatin Sodium Other (See Comments)   Pravastatin Other (See Comments)    Pt states this medication caused joint pain when taking it.    "

## 2024-12-25 ENCOUNTER — Encounter: Payer: Self-pay | Admitting: Nurse Practitioner

## 2025-01-06 ENCOUNTER — Ambulatory Visit: Admitting: Podiatry

## 2025-02-03 ENCOUNTER — Ambulatory Visit (HOSPITAL_COMMUNITY)

## 2025-05-23 ENCOUNTER — Ambulatory Visit: Admitting: Nurse Practitioner
